# Patient Record
Sex: Female | Born: 2000 | Race: Black or African American | Hispanic: Yes | Marital: Single | Smoking: Never smoker
Health system: Southern US, Community
[De-identification: ages and names within clinical notes are randomized; demographics above are authoritative.]

## PROBLEM LIST (undated history)

## (undated) DIAGNOSIS — M25569 Pain in unspecified knee: Secondary | ICD-10-CM

## (undated) DIAGNOSIS — F419 Anxiety disorder, unspecified: Secondary | ICD-10-CM

## (undated) DIAGNOSIS — R12 Heartburn: Secondary | ICD-10-CM

## (undated) DIAGNOSIS — E739 Lactose intolerance, unspecified: Secondary | ICD-10-CM

## (undated) DIAGNOSIS — R0602 Shortness of breath: Secondary | ICD-10-CM

## (undated) DIAGNOSIS — M722 Plantar fascial fibromatosis: Secondary | ICD-10-CM

## (undated) DIAGNOSIS — F32A Depression, unspecified: Secondary | ICD-10-CM

## (undated) DIAGNOSIS — F329 Major depressive disorder, single episode, unspecified: Secondary | ICD-10-CM

## (undated) DIAGNOSIS — T7840XA Allergy, unspecified, initial encounter: Secondary | ICD-10-CM

## (undated) DIAGNOSIS — G4730 Sleep apnea, unspecified: Secondary | ICD-10-CM

## (undated) DIAGNOSIS — H539 Unspecified visual disturbance: Secondary | ICD-10-CM

## (undated) HISTORY — DX: Pain in unspecified knee: M25.569

## (undated) HISTORY — DX: Anxiety disorder, unspecified: F41.9

## (undated) HISTORY — DX: Lactose intolerance, unspecified: E73.9

## (undated) HISTORY — DX: Plantar fascial fibromatosis: M72.2

## (undated) HISTORY — PX: FRACTURE SURGERY: SHX138

## (undated) HISTORY — DX: Heartburn: R12

## (undated) HISTORY — DX: Major depressive disorder, single episode, unspecified: F32.9

## (undated) HISTORY — DX: Depression, unspecified: F32.A

## (undated) HISTORY — DX: Shortness of breath: R06.02

## (undated) HISTORY — DX: Sleep apnea, unspecified: G47.30

---
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
## 2019-01-31 ENCOUNTER — Other Ambulatory Visit: Payer: Self-pay

## 2019-01-31 ENCOUNTER — Encounter: Payer: Self-pay | Admitting: Family Medicine

## 2019-01-31 ENCOUNTER — Ambulatory Visit (INDEPENDENT_AMBULATORY_CARE_PROVIDER_SITE_OTHER): Payer: BC Managed Care – PPO | Admitting: Family Medicine

## 2019-01-31 VITALS — BP 117/81 | Temp 97.3°F

## 2019-01-31 DIAGNOSIS — F909 Attention-deficit hyperactivity disorder, unspecified type: Secondary | ICD-10-CM | POA: Diagnosis not present

## 2019-01-31 DIAGNOSIS — F329 Major depressive disorder, single episode, unspecified: Secondary | ICD-10-CM

## 2019-01-31 DIAGNOSIS — F419 Anxiety disorder, unspecified: Secondary | ICD-10-CM | POA: Diagnosis not present

## 2019-01-31 DIAGNOSIS — F32A Depression, unspecified: Secondary | ICD-10-CM

## 2019-01-31 MED ORDER — BUPROPION HCL ER (XL) 150 MG PO TB24: 150.0000 mg | ORAL_TABLET | Freq: Every day | ORAL | 3 refills | Status: DC

## 2019-01-31 NOTE — Progress Notes (Signed)
   Virtual Visit via Video   I connected with patient on 01/31/19 at  4:00 PM EDT by a video enabled telemedicine application and verified that I am speaking with the correct person using two identifiers.  Location patient: Home Location provider: Acupuncturist, Office Persons participating in the virtual visit: Patient, Provider, [place] (Jess B)  I discussed the limitations of evaluation and management by telemedicine and the availability of in person appointments. The patient expressed understanding and agreed to proceed.  Subjective:   HPI:   ? ADHD- pt reports difficulty 'focusing, concentrating, and stuff'.  She reports this has been going on for a few years.  Never had to work hard in [employer] b/c 'it came easy'.  When she transitioned to college she began to have trouble and sxs worsened even more when learning went remote.  Pt reports being 'very fidgety'.  Easily distracted.  At times has hard time sitting still.  Pt reports she cannot finish stories b/c she will go off on tangents- people often comment on this.  Pt has hx of anxiety and feels she gets overwhelmed easily.  Is now grinding teeth at night.  + social anxiety.  Pt also w/ hx of depression.  Pt reports depression 'comes in waves'.  Has never been evaluated for ADHD.  ROS:   See pertinent positives and negatives per HPI.  Patient Active Problem List   Diagnosis Date Noted  . Bilateral impacted cerumen 12/06/2018  . Physical exam 09/02/2017    Social History   Tobacco Use  . Smoking status: Never Smoker  . Smokeless tobacco: Never Used  Substance Use Topics  . Alcohol use: Never    Frequency: Never    Current Outpatient Medications:  .  TRI-LO-ESTARYLLA 0.18/0.215/0.25 MG-25 MCG tab, TK 1 T PO QD, Disp: , Rfl: 0  No Known Allergies  Objective:   BP 117/81   Temp (!) 97.3 F (36.3 C) (Tympanic)   AAOx3, NAD NCAT, EOMI No obvious CN deficits obese Coloring WNL Pt is able to speak clearly, coherently  without shortness of breath or increased work of breathing.  Thought process is linear.  Mood is appropriate.   Assessment and Plan:   Possible ADHD- new. pt reports this has been ongoing for years but did not really impact her until last year when she went to college.  Sxs are more difficult to manage now that things have switched to remote learning.  She has never had an ADHD evaluation which will be needed to ensure we are treating her appropriately.  Referral placed for her to have complete evaluation.  In the meantime, will start Wellbutrin to treat underlying anxiety/depression and possible ADHD.  Pt expressed understanding and is in agreement w/ plan.   Anxiety/Depression- deteriorated.  Pt admits to increased anxiety and depression w/ all of the changes (going to college, COVID, etc)  We discussed that untreated anxiety/depression can also impact ability to focus and complete tasks.  Pt understands.  In the meantime, will start Wellbutrin to help w/ anxiety/depression and possible ADHD.  Pt expressed understanding and is in agreement w/ plan.    Annye Asa, MD 01/31/2019

## 2019-02-11 ENCOUNTER — Other Ambulatory Visit: Payer: Self-pay | Admitting: Orthopedic Surgery

## 2019-02-11 NOTE — Telephone Encounter (Signed)
Please advise.t ahnks.

## 2019-02-23 ENCOUNTER — Ambulatory Visit (INDEPENDENT_AMBULATORY_CARE_PROVIDER_SITE_OTHER): Payer: BC Managed Care – PPO | Admitting: Family Medicine

## 2019-02-23 ENCOUNTER — Encounter: Payer: Self-pay | Admitting: Family Medicine

## 2019-02-23 DIAGNOSIS — F909 Attention-deficit hyperactivity disorder, unspecified type: Secondary | ICD-10-CM

## 2019-02-23 DIAGNOSIS — F419 Anxiety disorder, unspecified: Secondary | ICD-10-CM

## 2019-02-23 DIAGNOSIS — F329 Major depressive disorder, single episode, unspecified: Secondary | ICD-10-CM

## 2019-02-23 DIAGNOSIS — F32A Depression, unspecified: Secondary | ICD-10-CM

## 2019-02-23 MED ORDER — CITALOPRAM HYDROBROMIDE 20 MG PO TABS: 20.0000 mg | ORAL_TABLET | Freq: Every day | ORAL | 3 refills | Status: DC

## 2019-02-23 NOTE — Progress Notes (Signed)
   Virtual Visit via Video   I connected with patient on 02/23/19 at  4:00 PM EST by a video enabled telemedicine application and verified that I am speaking with the correct person using two identifiers.  Location patient: Home Location provider: Acupuncturist, Office Persons participating in the virtual visit: Patient, Provider, Anna (Katie N)  I discussed the limitations of evaluation and management by telemedicine and the availability of in person appointments. The patient expressed understanding and agreed to proceed.  Subjective:   HPI:   Anxiety- pt was started on Wellbutrin at last visit to try and improve both anxiety and ADHD.  Pt reports no noticeable difference in anxiety or focus but has noticed decreased appetite.  Pt is still eating 2-3 meals/day but has cut out snacking and portion sizes are smaller.  Pt has scheduled ADHD assessment.  Pt feels the anxiety is more paralyzing and 'shuts me down'.  ROS:   See pertinent positives and negatives per HPI.  Patient Active Problem List   Diagnosis Date Noted  . Bilateral impacted cerumen 12/06/2018  . Physical exam 09/02/2017    Social History   Tobacco Use  . Smoking status: Never Smoker  . Smokeless tobacco: Never Used  Substance Use Topics  . Alcohol use: Never    Frequency: Never    Current Outpatient Medications:  .  buPROPion (WELLBUTRIN XL) 150 MG 24 hr tablet, Take 1 tablet (150 mg total) by mouth daily., Disp: 30 tablet, Rfl: 3 .  meloxicam (MOBIC) 15 MG tablet, TAKE 1 TABLET BY MOUTH EVERY DAY AS NEEDED FOR PAIN, Disp: 30 tablet, Rfl: 0 .  TRI-LO-ESTARYLLA 0.18/0.215/0.25 MG-25 MCG tab, TK 1 T PO QD, Disp: , Rfl: 0  No Known Allergies  Objective:   There were no vitals taken for this visit.  AAOx3, NAD obese NCAT, EOMI No obvious CN deficits Coloring WNL Pt is able to speak clearly, coherently without shortness of breath or increased work of breathing.  Thought process is linear.  Mood is  appropriate.   Assessment and Plan:   Anxiety/depression- pt scored a 17 on her PHQ9 today.  Discussed that based on this, we need to start SSRI to get sxs under better control.  She reports her anxiety can be paralyzing at time.  Agreeable to starting SSRI.  Will try Celexa knowing that we may have to titrate or try another entirely.  Pt expressed understanding and is in agreement w/ plan.   ADHD- pt has set up her assessment and we will decide on medication once results available.  Pt expressed understanding and is in agreement w/ plan.    Anna Asa, MD 02/23/2019

## 2019-03-14 ENCOUNTER — Encounter: Payer: Self-pay | Admitting: Family Medicine

## 2019-04-18 ENCOUNTER — Ambulatory Visit (INDEPENDENT_AMBULATORY_CARE_PROVIDER_SITE_OTHER): Payer: BC Managed Care – PPO | Admitting: Psychology

## 2019-04-18 DIAGNOSIS — F339 Major depressive disorder, recurrent, unspecified: Secondary | ICD-10-CM

## 2019-04-18 DIAGNOSIS — F419 Anxiety disorder, unspecified: Secondary | ICD-10-CM

## 2019-04-21 ENCOUNTER — Ambulatory Visit: Payer: BC Managed Care – PPO | Admitting: Psychology

## 2019-05-16 ENCOUNTER — Ambulatory Visit: Payer: BC Managed Care – PPO | Admitting: Psychology

## 2019-05-17 DIAGNOSIS — F4310 Post-traumatic stress disorder, unspecified: Secondary | ICD-10-CM | POA: Diagnosis not present

## 2019-05-17 DIAGNOSIS — F332 Major depressive disorder, recurrent severe without psychotic features: Secondary | ICD-10-CM | POA: Diagnosis not present

## 2019-06-05 ENCOUNTER — Other Ambulatory Visit: Payer: Self-pay | Admitting: Surgical

## 2019-06-16 DIAGNOSIS — M79671 Pain in right foot: Secondary | ICD-10-CM | POA: Insufficient documentation

## 2019-07-02 ENCOUNTER — Other Ambulatory Visit: Payer: Self-pay | Admitting: Surgical

## 2019-07-05 ENCOUNTER — Other Ambulatory Visit: Payer: Self-pay | Admitting: Emergency Medicine

## 2019-07-05 MED ORDER — BUPROPION HCL ER (XL) 150 MG PO TB24: 150.0000 mg | ORAL_TABLET | Freq: Every day | ORAL | 3 refills | Status: DC

## 2019-07-07 ENCOUNTER — Telehealth: Payer: BC Managed Care – PPO | Admitting: Physician Assistant

## 2019-07-07 DIAGNOSIS — J302 Other seasonal allergic rhinitis: Secondary | ICD-10-CM

## 2019-07-07 MED ORDER — SYSTANE COMPLETE 0.6 % OP SOLN: 1.0000 [drp] | Freq: Two times a day (BID) | OPHTHALMIC | 0 refills | Status: DC

## 2019-07-07 MED ORDER — CETIRIZINE HCL 10 MG PO TABS: 10.0000 mg | ORAL_TABLET | Freq: Two times a day (BID) | ORAL | 0 refills | Status: DC

## 2019-07-07 MED ORDER — FLUTICASONE PROPIONATE 50 MCG/ACT NA SUSP: 2.0000 | Freq: Every day | NASAL | 0 refills | Status: DC

## 2019-07-07 NOTE — Progress Notes (Signed)
E visit for Allergic Rhinitis We are sorry that you are not feeling well.  Here is how we plan to help!  It seems like you are doing all the right things with nasal spray andAallegra.  I would suggest some modifications to your regimen to help control your symptoms.  I recommend Flonase 2x per day, zyrtec 10mg  2x per day and the addition of some eye drops.  I have sent prescriptions for you as needed.  Additionally, if you are still struggling, you may alternate day and night doses of antihistamines - for example if you take Zyrtec in the morning you could take Claritin or Allegra at night.  Sometimes just switching your antihistamine is helpful as your body may be used to the medication.  A humidifier at night will also help with symptoms.  Based on what you have shared with me it looks like you have Allergic Rhinitis.  Rhinitis is when a reaction occurs that causes nasal congestion, runny nose, sneezing, and itching.  Most types of rhinitis are caused by an inflammation and are associated with symptoms in the eyes ears or throat. There are several types of rhinitis.  The most common are acute rhinitis, which is usually caused by a viral illness, allergic or seasonal rhinitis, and nonallergic or year-round rhinitis.  Nasal allergies occur certain times of the year.  Allergic rhinitis is caused when allergens in the air trigger the release of histamine in the body.  Histamine causes itching, swelling, and fluid to build up in the fragile linings of the nasal passages, sinuses and eyelids.  An itchy nose and clear discharge are common.  I recommend the following over the counter treatments: Zyrtec 10mg  2x per day  I also would recommend a nasal spray: Flonase 2 sprays into each nostril once daily  You may also benefit from eye drops such as: Systane 1-2 driops each eye twice daily as needed  HOME CARE:   You can use an over-the-counter saline nasal spray as needed  Avoid areas where there is  heavy dust, mites, or molds  Stay indoors on windy days during the pollen season  Keep windows closed in home, at least in bedroom; use air conditioner.  Use high-efficiency house air filter  Keep windows closed in car, turn AC on re-circulate  Avoid playing out with dog during pollen season  GET HELP RIGHT AWAY IF:   If your symptoms do not improve within 10 days  You become short of breath  You develop yellow or green discharge from your nose for over 3 days  You have coughing fits  MAKE SURE YOU:   Understand these instructions  Will watch your condition  Will get help right away if you are not doing well or get worse  Thank you for choosing an e-visit. Your e-visit answers were reviewed by a board certified advanced clinical practitioner to complete your personal care plan. Depending upon the condition, your plan could have included both over the counter or prescription medications. Please review your pharmacy choice. Be sure that the pharmacy you have chosen is open so that you can pick up your prescription now.  If there is a problem you may message your provider in [place] to have the prescription routed to another pharmacy. Your safety is important to [place]. If you have drug allergies check your prescription carefully.  For the next 24 hours, you can use MyChart to ask questions about today's visit, request a non-urgent call back, or ask for  a work or school excuse from your e-visit provider. You will get an email in the next two days asking about your experience. I hope that your e-visit has been valuable and will speed your recovery.   Greater than 5 minutes, yet less than 10 minutes of time have been spent researching, coordinating, and implementing care for this patient today

## 2019-08-06 ENCOUNTER — Other Ambulatory Visit: Payer: Self-pay | Admitting: Surgical

## 2019-08-08 ENCOUNTER — Other Ambulatory Visit: Payer: Self-pay | Admitting: General Practice

## 2019-08-08 MED ORDER — CITALOPRAM HYDROBROMIDE 20 MG PO TABS: 20.0000 mg | ORAL_TABLET | Freq: Every day | ORAL | 3 refills | Status: DC

## 2019-08-08 NOTE — Telephone Encounter (Signed)
Ok to rf? 

## 2019-08-31 ENCOUNTER — Other Ambulatory Visit: Payer: Self-pay

## 2019-08-31 ENCOUNTER — Ambulatory Visit: Payer: BC Managed Care – PPO | Admitting: Orthopedic Surgery

## 2019-08-31 ENCOUNTER — Ambulatory Visit: Payer: Self-pay

## 2019-08-31 DIAGNOSIS — R200 Anesthesia of skin: Secondary | ICD-10-CM

## 2019-08-31 DIAGNOSIS — R202 Paresthesia of skin: Secondary | ICD-10-CM | POA: Diagnosis not present

## 2019-08-31 DIAGNOSIS — M79671 Pain in right foot: Secondary | ICD-10-CM

## 2019-09-05 ENCOUNTER — Encounter: Payer: Self-pay | Admitting: Orthopedic Surgery

## 2019-09-05 NOTE — Progress Notes (Signed)
Office Visit Note   Patient: Anna Holland           Date of Birth: 2001-03-14           MRN: [id number] Visit Date: 08/31/2019 Requested by: Midge Minium, MD [address] [place] [address] [place],  [place] [address] PCP: Midge Minium, MD  Subjective: Chief Complaint  Patient presents with  . Right Foot - Pain, Numbness    HPI: Anna Holland is a 19 y.o. female who presents to the office complaining of right foot pain.  She notes 3 months of pain in her right foot with pain as well as numbness/tingling that bothers her on the top of her foot between first and second toes and around the midfoot dorsally.  She had a steroid course that helped briefly but her symptoms came back.  She feels pain in her anterior shin that feels "like a bruise".  This pain is worse with dorsiflexion of her foot.  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  She notes symptoms get worse with walking and improves when she takes her break.  Symptoms also get worse when she does her cardio workout with foot weakness during the workout but resolves several minutes after she rests.  Her mom has noticed her foot slapping in shoes at times.  She has no history of surgery.  Denies any knee pain or low back pain.  He does have a history of a right ganglion cyst that was last evaluated in June 2020.  She takes meloxicam as needed.              ROS:  All systems reviewed are negative as they relate to the chief complaint within the history of present illness.  Patient denies fevers or chills.  Assessment & Plan: Visit Diagnoses:  1. Pain in right foot   2. Numbness and tingling of foot     Plan: Patient is a 19 year old female who presents complaining of right foot pain with numbness and tingling.  Pain is worse with exertion.  She has no significant tenderness to palpation throughout the right lower extremity compartments today.  X-rays reveal no acute osseous findings.  Symptoms seem to resolve with rest.   Ultrasound was performed in the office today no discrete masses visualized but MRI would be a better modality for definitive evaluation of a webspace cyst or ganglion..  Differential diagnosis includes cyst in the webspace between the first and second toes versus deep peroneal nerve compression.  No tenderness to palpation or evidence on physical examination of peroneal nerve compression around the fibular head.  Plan for 8-week return for clinical recheck.  We will decide on MRI or nerve conduction study or both at that point.  Patient agreed with this plan.  She will follow-up with the office sooner if her symptoms significantly worsen.  Follow-Up Instructions: No follow-ups on file.   Orders:  Orders Placed This Encounter  Procedures  . XR Foot Complete Right   No orders of the defined types were placed in this encounter.     Procedures: No procedures performed   Clinical Data: No additional findings.  Objective: Vital Signs: There were no vitals taken for this visit.  Physical Exam:  Constitutional: Patient appears well-developed HEENT:  Head: Normocephalic Eyes:EOM are normal Neck: Normal range of motion Cardiovascular: Normal rate Pulmonary/chest: Effort normal Neurologic: Patient is alert Skin: Skin is warm Psychiatric: Patient has normal mood and affect  Ortho  Exam:  Mild tenderness palpation in the anterior distal tibia.  Pain worse with resisted dorsiflexion.  No pain with passive dorsiflexion/plantarflexion of the ankle or toes.  Sensation intact through all dermatomes of the right lower extremity.  No Morton's neuroma identified by physical exam.  Mild tenderness palpation throughout the webspace between the first and second digits.  No tenderness palpation over the medial/lateral malleoli, retrocalcaneal space, Achilles tendon, anterior tibialis tendon, fifth metatarsal base, peroneal tendon, posterior tib tendon.  Dorsiflexion/plantarflexion/inversion/eversion intact  actively.  No antalgic gait  Specialty Comments:  No specialty comments available.  Imaging: No results found.   PMFS History: Patient Active Problem List   Diagnosis Date Noted  . Bilateral impacted cerumen 12/06/2018  . Physical exam 09/02/2017   Past Medical History:  Diagnosis Date  . Allergy   . Depression   . Vision abnormalities     Family History  Problem Relation Age of Onset  . Hypertension Mother   . Hyperlipidemia Mother   . Arthritis Father   . Arthritis Maternal Grandmother   . Diabetes Maternal Grandmother   . Hypertension Maternal Grandmother   . Miscarriages / Stillbirths Maternal Grandmother   . Stroke Maternal Grandmother   . Heart disease Maternal Grandfather   . Hypertension Maternal Grandfather   . Stroke Maternal Grandfather   . Depression Brother     Past Surgical History:  Procedure Laterality Date  . FRACTURE SURGERY     left arm in 2009  . LAPAROSCOPIC APPENDECTOMY N/A 12/30/2012   Procedure: APPENDECTOMY LAPAROSCOPIC;  Surgeon: Judie Petit. Leonia Corona, MD;  Location: [facility] OR;  Service: Pediatrics;  Laterality: N/A;   Social History   Occupational History  . Occupation: Consulting civil engineer  Tobacco Use  . Smoking status: Never Smoker  . Smokeless tobacco: Never Used  Substance and Sexual Activity  . Alcohol use: Never  . Drug use: No  . Sexual activity: Yes    Birth control/protection: Pill

## 2019-09-12 ENCOUNTER — Ambulatory Visit: Payer: BC Managed Care – PPO

## 2019-09-13 ENCOUNTER — Other Ambulatory Visit: Payer: Self-pay

## 2019-09-13 ENCOUNTER — Ambulatory Visit (INDEPENDENT_AMBULATORY_CARE_PROVIDER_SITE_OTHER): Payer: BC Managed Care – PPO

## 2019-09-13 DIAGNOSIS — Z111 Encounter for screening for respiratory tuberculosis: Secondary | ICD-10-CM

## 2019-09-13 NOTE — Progress Notes (Addendum)
Anna Holland, 19 year old female, presents to the office for ppd placement. Tuberculin Purified Protein Derivative 0.53mL placed in left forearm. Patient tolerated well and informed to return for reading in 48 hours (Thursday). Patient voiced understanding.  The above order is mine.  Neena Rhymes, MD

## 2019-09-14 NOTE — Addendum Note (Signed)
Addended by: Sheliah Hatch on: 09/14/2019 08:01 AM   Modules accepted: Level of Service

## 2019-09-15 ENCOUNTER — Other Ambulatory Visit: Payer: Self-pay

## 2019-09-15 ENCOUNTER — Ambulatory Visit: Payer: BC Managed Care – PPO

## 2019-09-15 DIAGNOSIS — Z111 Encounter for screening for respiratory tuberculosis: Secondary | ICD-10-CM

## 2019-09-15 LAB — TB SKIN TEST
Induration: 0 mm
TB Skin Test: NEGATIVE

## 2019-09-15 NOTE — Progress Notes (Signed)
Anna Holland, 19 year old presents to the office for a PPD reading. Left arm shows 0 mm induration. Patient left the office in good condition. Lana Fish, LPN

## 2019-12-30 ENCOUNTER — Ambulatory Visit: Payer: BC Managed Care – PPO | Admitting: Podiatry

## 2020-06-13 ENCOUNTER — Other Ambulatory Visit: Payer: Self-pay

## 2020-06-13 ENCOUNTER — Encounter: Payer: Self-pay | Admitting: Family Medicine

## 2020-06-13 ENCOUNTER — Ambulatory Visit (INDEPENDENT_AMBULATORY_CARE_PROVIDER_SITE_OTHER): Payer: BC Managed Care – PPO | Admitting: Family Medicine

## 2020-06-13 VITALS — BP 125/70 | HR 67 | Temp 97.6°F | Resp 18 | Ht 60.5 in | Wt 238.8 lb

## 2020-06-13 DIAGNOSIS — Z23 Encounter for immunization: Secondary | ICD-10-CM

## 2020-06-13 DIAGNOSIS — Z0000 Encounter for general adult medical examination without abnormal findings: Secondary | ICD-10-CM

## 2020-06-13 DIAGNOSIS — Z114 Encounter for screening for human immunodeficiency virus [HIV]: Secondary | ICD-10-CM

## 2020-06-13 DIAGNOSIS — K219 Gastro-esophageal reflux disease without esophagitis: Secondary | ICD-10-CM

## 2020-06-13 LAB — CBC WITH DIFFERENTIAL/PLATELET
Basophils Absolute: 0.1 10*3/uL (ref 0.0–0.1)
Basophils Relative: 1.3 % (ref 0.0–3.0)
Eosinophils Absolute: 0.1 10*3/uL (ref 0.0–0.7)
Eosinophils Relative: 1.5 % (ref 0.0–5.0)
HCT: 37.3 % (ref 36.0–46.0)
Hemoglobin: 12.5 g/dL (ref 12.0–15.0)
Lymphocytes Relative: 34.2 % (ref 12.0–46.0)
Lymphs Abs: 3.2 10*3/uL (ref 0.7–4.0)
MCHC: 33.5 g/dL (ref 30.0–36.0)
MCV: 78.7 fl (ref 78.0–100.0)
Monocytes Absolute: 0.3 10*3/uL (ref 0.1–1.0)
Monocytes Relative: 3.7 % (ref 3.0–12.0)
Neutro Abs: 5.6 10*3/uL (ref 1.4–7.7)
Neutrophils Relative %: 59.3 % (ref 43.0–77.0)
Platelets: 383 10*3/uL (ref 150.0–400.0)
RBC: 4.75 Mil/uL (ref 3.87–5.11)
RDW: 14.2 % (ref 11.5–15.5)
WBC: 9.5 10*3/uL (ref 4.0–10.5)

## 2020-06-13 LAB — LIPID PANEL
Cholesterol: 163 mg/dL (ref 0–200)
HDL: 33.3 mg/dL — ABNORMAL LOW (ref >39.00)
LDL Cholesterol: 106 mg/dL — ABNORMAL HIGH (ref 0–99)
NonHDL: 129.46
Total CHOL/HDL Ratio: 5
Triglycerides: 116 mg/dL (ref 0.0–149.0)
VLDL: 23.2 mg/dL (ref 0.0–40.0)

## 2020-06-13 LAB — BASIC METABOLIC PANEL
BUN: 9 mg/dL (ref 6–23)
CO2: 26 mEq/L (ref 19–32)
Calcium: 9.5 mg/dL (ref 8.4–10.5)
Chloride: 103 mEq/L (ref 96–112)
Creatinine, Ser: 0.46 mg/dL (ref 0.40–1.20)
GFR: 137.17 mL/min (ref >60.00)
Glucose, Bld: 82 mg/dL (ref 70–99)
Potassium: 3.7 mEq/L (ref 3.5–5.1)
Sodium: 137 mEq/L (ref 135–145)

## 2020-06-13 LAB — HEPATIC FUNCTION PANEL
ALT: 11 U/L (ref 0–35)
AST: 15 U/L (ref 0–37)
Albumin: 3.8 g/dL (ref 3.5–5.2)
Alkaline Phosphatase: 106 U/L (ref 39–117)
Bilirubin, Direct: 0.1 mg/dL (ref 0.0–0.3)
Total Bilirubin: 0.3 mg/dL (ref 0.2–1.2)
Total Protein: 7.9 g/dL (ref 6.0–8.3)

## 2020-06-13 LAB — TSH: TSH: 2.94 u[IU]/mL (ref 0.35–4.50)

## 2020-06-13 LAB — VITAMIN D 25 HYDROXY (VIT D DEFICIENCY, FRACTURES): VITD: 19.7 ng/mL — ABNORMAL LOW (ref 30.00–100.00)

## 2020-06-13 MED ORDER — PANTOPRAZOLE SODIUM 40 MG PO TBEC: 40.0000 mg | DELAYED_RELEASE_TABLET | Freq: Every day | ORAL | 1 refills | Status: DC

## 2020-06-13 NOTE — Progress Notes (Signed)
   Subjective:    Patient ID: Anna Holland, female    DOB: 2000-07-19, 20 y.o.   MRN: [id number]  HPI CPE-  Due for Tdap.  UTD on COVID, Flu.  Due to start pap smears- scheduled for October.  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   Patient Care Team    Relationship Specialty Notifications Start End  Sheliah Hatch, MD PCP - General Family Medicine  09/02/17   Jacqlyn Krauss, MD Referring Physician Dermatology  09/02/17   Hal Morales, MD (Inactive) Consulting Physician Obstetrics and Gynecology  09/02/17      Health Maintenance  Topic Date Due  . Hepatitis C Screening  Never done  . HIV Screening  Never done  . TETANUS/TDAP  06/04/2020  . PAP-Cervical Cytology Screening  06/03/2020  . PAP SMEAR-Modifier  06/03/2020  . CHLAMYDIA SCREENING  06/13/2021 (Originally 06/03/2014)  . INFLUENZA VACCINE  Completed  . HPV VACCINES  Completed  . COVID-19 Vaccine  Completed      Review of Systems Patient reports no vision/ hearing changes, adenopathy,fever, persistant/recurrent hoarseness , swallowing issues, chest pain, palpitations, edema, persistant/recurrent cough, hemoptysis, dyspnea (rest/exertional/paroxysmal nocturnal), gastrointestinal bleeding (melena, rectal bleeding), abdominal pain, bowel changes, GU symptoms (dysuria, hematuria, incontinence), Gyn symptoms (abnormal  bleeding, pain),  syncope, focal weakness, memory loss, numbness & tingling, skin/hair/nail changes, abnormal bruising or bleeding, anxiety, or depression.   + 20 lb weight gain + heartburn 'with everything I eat'- taking Pepcid and OTC antacids w/ continued sxs  This visit occurred during the SARS-CoV-2 public health emergency.  Safety protocols were in place, including screening questions prior to the visit, additional usage of staff PPE, and extensive cleaning of exam room while observing appropriate contact  time as indicated for disinfecting solutions.       Objective:   Physical Exam General Appearance:    Alert, cooperative, no distress, appears stated age  Head:    Normocephalic, without obvious abnormality, atraumatic  Eyes:    PERRL, conjunctiva/corneas clear, EOM's intact, fundi    benign, both eyes  Ears:    Normal TM's and external ear canals, both ears  Nose:   Nares normal, septum midline, mucosa normal, no drainage    or sinus tenderness  Throat:   Lips, mucosa, and tongue normal; teeth and gums normal  Neck:   Supple, symmetrical, trachea midline, no adenopathy;    Thyroid: no enlargement/tenderness/nodules  Back:     Symmetric, no curvature, ROM normal, no CVA tenderness  Lungs:     Clear to auscultation bilaterally, respirations unlabored  Chest Wall:    No tenderness or deformity   Heart:    Regular rate and rhythm, S1 and S2 normal, no murmur, rub   or gallop  Breast Exam:    Deferred to GYN  Abdomen:     Soft, non-tender, bowel sounds active all four quadrants,    no masses, no organomegaly  Genitalia:    Deferred to GYN  Rectal:    Extremities:   Extremities normal, atraumatic, no cyanosis or edema  Pulses:   2+ and symmetric all extremities  Skin:   Skin color, texture, turgor normal, no rashes or lesions  Lymph nodes:   Cervical, supraclavicular, and axillary nodes normal  Neurologic:   CNII-XII intact, normal strength, sensation and reflexes    throughout          Assessment & Plan:

## 2020-06-13 NOTE — Assessment & Plan Note (Signed)
Deteriorated.  Pt has gained 20 lbs since last visit.  Discussed need for healthy diet and regular exercise.  Check labs to risk stratify.  Will follow.

## 2020-06-13 NOTE — Patient Instructions (Signed)
Follow up in 6 months to recheck weight loss progress We'll notify you of your lab results and make any changes if needed Check with the [facility] about training options YOU. CAN. DO. THIS. START the Pantoprazole daily to help with the heartburn Call with any questions or concerns Stay Safe!  Stay Healthy! CONGRATS ON NURSING SCHOOL!!

## 2020-06-13 NOTE — Assessment & Plan Note (Signed)
Pt's PE WNL w/ exception of obesity.  UTD on COVID and flu.  Tdap given today.  Has GYN exam scheduled.  Check labs.  Anticipatory guidance provided.

## 2020-06-13 NOTE — Assessment & Plan Note (Signed)
New.  Pt's sxs are moderate to severe and occur after every meal- despite taking Pepcid and OTC antacids.  Will start Protonix.  Discussed dietary and lifestyle modifications that will improve sxs.  Pt expressed understanding and is in agreement w/ plan.

## 2020-06-14 ENCOUNTER — Other Ambulatory Visit: Payer: Self-pay

## 2020-06-14 DIAGNOSIS — E559 Vitamin D deficiency, unspecified: Secondary | ICD-10-CM

## 2020-06-14 LAB — HIV ANTIBODY (ROUTINE TESTING W REFLEX): HIV 1&2 Ab, 4th Generation: NONREACTIVE

## 2020-06-14 LAB — EXTRA SPECIMEN

## 2020-06-14 MED ORDER — VITAMIN D (ERGOCALCIFEROL) 1.25 MG (50000 UNIT) PO CAPS: 50000.0000 [IU] | ORAL_CAPSULE | ORAL | 0 refills | Status: DC

## 2020-06-29 ENCOUNTER — Telehealth: Payer: Self-pay | Admitting: Family Medicine

## 2020-06-29 NOTE — Telephone Encounter (Signed)
Patient called needing a tb blood test for her new job - she also needs a physical paper filled out - just had her physical on 06/13/2020 - please advise

## 2020-07-02 ENCOUNTER — Other Ambulatory Visit: Payer: Self-pay

## 2020-07-02 DIAGNOSIS — Z111 Encounter for screening for respiratory tuberculosis: Secondary | ICD-10-CM

## 2020-07-02 NOTE — Telephone Encounter (Signed)
We can complete her forms based on her recent physical and she can have a lab visit for quantiferon for TB screen

## 2020-07-02 NOTE — Telephone Encounter (Signed)
Ok to order a Tb blood test?

## 2020-07-02 NOTE — Telephone Encounter (Signed)
Called patient and informed that per provider that she can come in for a lab only visit for a TB scre/ening. Patient understood. Patient is scheduled for 07/23/20 at 9.

## 2020-07-10 ENCOUNTER — Encounter: Payer: Self-pay | Admitting: Family Medicine

## 2020-07-10 MED ORDER — FLUTICASONE PROPIONATE 50 MCG/ACT NA SUSP: 2.0000 | Freq: Every day | NASAL | 3 refills | Status: DC

## 2020-07-10 MED ORDER — SYSTANE COMPLETE 0.6 % OP SOLN: 1.0000 [drp] | Freq: Two times a day (BID) | OPHTHALMIC | 3 refills | Status: DC

## 2020-07-23 ENCOUNTER — Telehealth: Payer: Self-pay | Admitting: Family Medicine

## 2020-07-23 ENCOUNTER — Other Ambulatory Visit: Payer: BC Managed Care – PPO

## 2020-07-23 ENCOUNTER — Other Ambulatory Visit: Payer: Self-pay

## 2020-07-23 DIAGNOSIS — Z111 Encounter for screening for respiratory tuberculosis: Secondary | ICD-10-CM

## 2020-07-23 NOTE — Telephone Encounter (Signed)
Patient forms are up front to be completed. Patient would like a call back when paperwork is ready. Please Advise.

## 2020-07-23 NOTE — Telephone Encounter (Signed)
Forms placed in bin to be filled 

## 2020-07-23 NOTE — Telephone Encounter (Signed)
..  Type of form received:  Statement of Mental & Physical Health Additional comments:   Received OT:LXBWI Atkins  Form should be Faxed to:  Form should be mailed to:    Is patient requesting call for pickup:  [phone]   Form placed:  In Dr. Rennis Golden bin up front  Attach charge sheet.  Provider will determine charge.  Individual made aware of 3-5 business day turn around (Y/N)?

## 2020-07-25 LAB — QUANTIFERON-TB GOLD PLUS
Mitogen-NIL: >10 IU/mL
NIL: 0.02 IU/mL
QuantiFERON-TB Gold Plus: NEGATIVE
TB1-NIL: 0.01 IU/mL
TB2-NIL: 0.01 IU/mL

## 2020-07-26 NOTE — Telephone Encounter (Signed)
Form completed and placed in basket  

## 2020-07-26 NOTE — Telephone Encounter (Signed)
I have made pt aware of the form is ready for pick up. I have sent a copy to scan.

## 2020-07-30 ENCOUNTER — Encounter: Payer: Self-pay | Admitting: Family Medicine

## 2020-10-03 ENCOUNTER — Encounter: Payer: Self-pay | Admitting: *Deleted

## 2020-11-07 ENCOUNTER — Other Ambulatory Visit: Payer: Self-pay

## 2020-11-07 DIAGNOSIS — F32A Depression, unspecified: Secondary | ICD-10-CM

## 2020-11-07 DIAGNOSIS — F419 Anxiety disorder, unspecified: Secondary | ICD-10-CM

## 2020-11-07 MED ORDER — CITALOPRAM HYDROBROMIDE 20 MG PO TABS: 20.0000 mg | ORAL_TABLET | Freq: Every day | ORAL | 3 refills | Status: DC

## 2020-11-07 MED ORDER — BUPROPION HCL ER (XL) 150 MG PO TB24: 150.0000 mg | ORAL_TABLET | Freq: Every day | ORAL | 3 refills | Status: DC

## 2020-12-10 DIAGNOSIS — Z0289 Encounter for other administrative examinations: Secondary | ICD-10-CM

## 2020-12-14 ENCOUNTER — Ambulatory Visit: Payer: BC Managed Care – PPO | Admitting: Family Medicine

## 2020-12-20 ENCOUNTER — Encounter (INDEPENDENT_AMBULATORY_CARE_PROVIDER_SITE_OTHER): Payer: Self-pay | Admitting: Bariatrics

## 2020-12-20 ENCOUNTER — Other Ambulatory Visit: Payer: Self-pay

## 2020-12-20 ENCOUNTER — Ambulatory Visit (INDEPENDENT_AMBULATORY_CARE_PROVIDER_SITE_OTHER): Payer: BC Managed Care – PPO | Admitting: Bariatrics

## 2020-12-20 VITALS — BP 120/80 | HR 77 | Temp 98.6°F | Ht 60.0 in | Wt 235.0 lb

## 2020-12-20 DIAGNOSIS — Z1331 Encounter for screening for depression: Secondary | ICD-10-CM | POA: Diagnosis not present

## 2020-12-20 DIAGNOSIS — M722 Plantar fascial fibromatosis: Secondary | ICD-10-CM

## 2020-12-20 DIAGNOSIS — L209 Atopic dermatitis, unspecified: Secondary | ICD-10-CM | POA: Insufficient documentation

## 2020-12-20 DIAGNOSIS — Z6841 Body Mass Index (BMI) 40.0 and over, adult: Secondary | ICD-10-CM

## 2020-12-20 DIAGNOSIS — K219 Gastro-esophageal reflux disease without esophagitis: Secondary | ICD-10-CM

## 2020-12-20 DIAGNOSIS — R0602 Shortness of breath: Secondary | ICD-10-CM | POA: Diagnosis not present

## 2020-12-20 DIAGNOSIS — R5383 Other fatigue: Secondary | ICD-10-CM

## 2020-12-20 DIAGNOSIS — Z9189 Other specified personal risk factors, not elsewhere classified: Secondary | ICD-10-CM | POA: Diagnosis not present

## 2020-12-20 DIAGNOSIS — E66813 Obesity, class 3: Secondary | ICD-10-CM

## 2020-12-20 DIAGNOSIS — R0683 Snoring: Secondary | ICD-10-CM

## 2020-12-20 DIAGNOSIS — E559 Vitamin D deficiency, unspecified: Secondary | ICD-10-CM | POA: Diagnosis not present

## 2020-12-20 DIAGNOSIS — R7309 Other abnormal glucose: Secondary | ICD-10-CM

## 2020-12-20 DIAGNOSIS — L700 Acne vulgaris: Secondary | ICD-10-CM | POA: Insufficient documentation

## 2020-12-20 NOTE — Progress Notes (Signed)
Chief Complaint:   OBESITY Anna Holland (MR# [id number]) is a 20 y.o. female who presents for evaluation and treatment of obesity and related comorbidities. Current BMI is Body mass index is 45.9 kg/m. Anna Holland has been struggling with her weight for many years and has been unsuccessful in either losing weight, maintaining weight loss, or reaching her healthy weight goal.  Anna Holland is currently in the action stage of change and ready to dedicate time achieving and maintaining a healthier weight. Anna Holland is interested in becoming our patient and working on intensive lifestyle modifications including (but not limited to) diet and exercise for weight loss.  Anna Holland likes to cook but notes time as an obstacle.  She craves sweets at times.  Anna Holland's habits were reviewed today and are as follows: Her family eats meals together, she thinks her family will eat healthier with her, her desired weight loss is 76 pounds, she has been heavy most of her life, she started gaining weight in college, her heaviest weight ever was 237 pounds, she craves milkshakes and bread, she skips lunch frequently, she is frequently drinking liquids with calories, she frequently makes poor food choices, she frequently eats larger portions than normal, and she struggles with emotional eating.  Depression Screen Anna Holland's Food and Mood (modified PHQ-9) score was 14.  Depression screen PHQ 2/9 12/20/2020  Decreased Interest 1  Down, Depressed, Hopeless 2  PHQ - 2 Score 3  Altered sleeping 1  Tired, decreased energy 3  Change in appetite 2  Feeling bad or failure about yourself  2  Trouble concentrating 2  Moving slowly or fidgety/restless 1  Suicidal thoughts 0  PHQ-9 Score 14  Difficult doing work/chores Somewhat difficult   Subjective:   1. Other fatigue Anna Holland denies daytime somnolence and reports waking up still tired. Patent has a history of symptoms of morning fatigue and snoring. Anna Holland generally gets 6 hours of  sleep per night, and states that she has poor quality sleep. Snoring is present. Apneic episodes are not present. Epworth Sleepiness Score is 9.  Occurs with certain activities.  2. SOB (shortness of breath) on exertion Anna Holland notes increasing shortness of breath with exercising and seems to be worsening over time with weight gain. She notes getting out of breath sooner with activity than she used to. This has gotten worse recently. Anna Holland denies shortness of breath at rest or orthopnea.  Occurs with certain activities.  3. Vitamin D deficiency She is currently taking no vitamin D supplement.  Vitamin D 19.7 on 06/13/2020.  Lab Results  Component Value Date   VD25OH 19.70 (L) 06/13/2020   VD25OH 12.68 (L) 09/02/2017   4. Gastroesophageal reflux disease, unspecified whether esophagitis present Taking pantoprazole.  She says it helps, but GERD is not resolved.  5. Plantar fasciitis Taking meloxicam.  6. Snoring Anna Holland endorses snoring.  She has not had a sleep study.  7. Elevated glucose No medications.  No family history of diabetes.  8. Depression screen Anna Holland was screened for depression as part of her new patient workup today.  PHQ-9 is 14.  9. At risk for activity intolerance [name] is at risk for activity intolerance due to obesity.  Assessment/Plan:   1. Other fatigue [name] does feel that her weight is causing her energy to be lower than it should be. Fatigue may be related to obesity, depression or many other causes. Labs will be ordered, and in the meanwhile, [name] will focus on self care including making healthy food choices,  increasing physical activity and focusing on stress reduction.  Gradually increase activities.  - EKG 12-Lead - T3 - T4, free - TSH  2. SOB (shortness of breath) on exertion [name] does feel that she gets out of breath more easily that she used to when she exercises. [name] shortness of breath appears to be obesity related and exercise  induced. She has agreed to work on weight loss and gradually increase exercise to treat her exercise induced shortness of breath. Will continue to monitor closely.  Gradually increase activities.  3. Vitamin D deficiency Will check vitamin D level today.  - VITAMIN D 25 Hydroxy (Vit-D Deficiency, Fractures)  4. Gastroesophageal reflux disease, unspecified whether esophagitis present Intensive lifestyle modifications are the first line treatment for this issue. We discussed several lifestyle modifications today and she will continue to work on diet, exercise and weight loss efforts. Orders and follow up as documented in patient record. Continue medications.  Will watch her triggers.  Counseling If a person has gastroesophageal reflux disease (GERD), food and stomach acid move back up into the esophagus and cause symptoms or problems such as damage to the esophagus. Anti-reflux measures include: raising the head of the bed, avoiding tight clothing or belts, avoiding eating late at night, not lying down shortly after mealtime, and achieving weight loss. Avoid ASA, NSAID's, caffeine, alcohol, and tobacco.  OTC Pepcid and/or Tums are often very helpful for as needed use.  However, for persisting chronic or daily symptoms, stronger medications like Omeprazole may be needed. You may need to avoid foods and drinks such as: Coffee and tea (with or without caffeine). Drinks that contain alcohol. Energy drinks and sports drinks. Bubbly (carbonated) drinks or sodas. Chocolate and cocoa. Peppermint and mint flavorings. Garlic and onions. Horseradish. Spicy and acidic foods. These include peppers, chili powder, curry powder, vinegar, hot sauces, and BBQ sauce. Citrus fruit juices and citrus fruits, such as oranges, lemons, and limes. Tomato-based foods. These include red sauce, chili, salsa, and pizza with red sauce. Fried and fatty foods. These include donuts, french fries, potato chips, and high-fat  dressings. High-fat meats. These include hot dogs, rib eye steak, sausage, ham, and bacon.  - Comprehensive metabolic panel - T3 - T4, free - TSH  5. Plantar fasciitis Watch over triggers.  Wear good shoes.  6. Snoring Will wait and see if weight loss improves symptoms.  If not, then do sleep study.  7. Elevated glucose Will check A1c and insulin today.  - Hemoglobin A1c - Insulin, random  8. Depression screen Anna Holland had a positive depression screening. Depression is commonly associated with obesity and often results in emotional eating behaviors. We will monitor this closely and work on CBT to help improve the non-hunger eating patterns. Referral to Psychology may be required if no improvement is seen as she continues in our clinic.  9. At risk for activity intolerance Anna Holland was given approximately 15 minutes of exercise intolerance counseling today. She is 20 y.o. female and has risk factors exercise intolerance including obesity. We discussed intensive lifestyle modifications today with an emphasis on specific weight loss instructions and strategies. Anna Holland will slowly increase activity as tolerated.  Repetitive spaced learning was employed today to elicit superior memory formation and behavioral change.  10. Class 3 severe obesity with serious comorbidity and body mass index (BMI) of 45.0 to 49.9 in adult, unspecified obesity type (HCC)  Anna Holland is currently in the action stage of change and her goal is to continue with weight loss  efforts. I recommend Anna Holland begin the structured treatment plan as follows:  She has agreed to the Category 2 Plan.  She will work on meal planning, intentional eating, and eliminating sugary drinks.  [employer] [place] [address] handout provided.  Reviewed labs from 06/13/2020, including CMP, lipid panel, vitamin D, CBC, TSH.  Exercise goals: No exercise has been prescribed at this time.   Behavioral modification strategies: increasing lean protein intake,  decreasing simple carbohydrates, increasing vegetables, increasing water intake, decreasing eating out, no skipping meals, meal planning and cooking strategies, keeping healthy foods in the home, and planning for success.  She was informed of the importance of frequent follow-up visits to maximize her success with intensive lifestyle modifications for her multiple health conditions. She was informed we would discuss her lab results at her next visit unless there is a critical issue that needs to be addressed sooner. Anna Holland agreed to keep her next visit at the agreed upon time to discuss these results.  Objective:   Blood pressure 120/80, pulse 77, temperature 98.6 F (37 C), height 5' (1.524 m), weight 235 lb (106.6 kg), SpO2 99 %. Body mass index is 45.9 kg/m.  EKG: Normal sinus rhythm, rate 78 bpm.  Indirect Calorimeter completed today shows a VO2 of 221 and a REE of 1526.  Her calculated basal metabolic rate is 3220 thus her basal metabolic rate is worse than expected.  General: Cooperative, alert, well developed, in no acute distress. HEENT: Conjunctivae and lids unremarkable. Cardiovascular: Regular rhythm.  Lungs: Normal work of breathing. Neurologic: No focal deficits.   Lab Results  Component Value Date   CREATININE 0.46 06/13/2020   BUN 9 06/13/2020   NA 137 06/13/2020   K 3.7 06/13/2020   CL 103 06/13/2020   CO2 26 06/13/2020   Lab Results  Component Value Date   ALT 11 06/13/2020   AST 15 06/13/2020   ALKPHOS 106 06/13/2020   BILITOT 0.3 06/13/2020   Lab Results  Component Value Date   TSH 2.94 06/13/2020   Lab Results  Component Value Date   CHOL 163 06/13/2020   HDL 33.30 (L) 06/13/2020   LDLCALC 106 (H) 06/13/2020   LDLDIRECT 126.0 09/02/2017   TRIG 116.0 06/13/2020   CHOLHDL 5 06/13/2020   Lab Results  Component Value Date   WBC 9.5 06/13/2020   HGB 12.5 06/13/2020   HCT 37.3 06/13/2020   MCV 78.7 06/13/2020   PLT 383.0 06/13/2020   Attestation  Statements:   Reviewed by clinician on day of visit: allergies, medications, problem list, medical history, surgical history, family history, social history, and previous encounter notes.  I, Insurance claims handler, CMA, am acting as Energy manager for [employer], DO  I have reviewed the above documentation for accuracy and completeness, and I agree with the above. Corinna Capra, DO

## 2020-12-21 LAB — TSH: TSH: 1.65 u[IU]/mL (ref 0.450–4.500)

## 2020-12-21 LAB — COMPREHENSIVE METABOLIC PANEL
ALT: 12 IU/L (ref 0–32)
AST: 18 IU/L (ref 0–40)
Albumin/Globulin Ratio: 1 — ABNORMAL LOW (ref 1.2–2.2)
Albumin: 3.9 g/dL (ref 3.9–5.0)
Alkaline Phosphatase: 122 IU/L — ABNORMAL HIGH (ref 44–121)
BUN/Creatinine Ratio: 16 (ref 9–23)
BUN: 8 mg/dL (ref 6–20)
Bilirubin Total: <0.2 mg/dL (ref 0.0–1.2)
CO2: 26 mmol/L (ref 20–29)
Calcium: 9.5 mg/dL (ref 8.7–10.2)
Chloride: 98 mmol/L (ref 96–106)
Creatinine, Ser: 0.49 mg/dL — ABNORMAL LOW (ref 0.57–1.00)
Globulin, Total: 3.8 g/dL (ref 1.5–4.5)
Glucose: 74 mg/dL (ref 65–99)
Potassium: 4.5 mmol/L (ref 3.5–5.2)
Sodium: 139 mmol/L (ref 134–144)
Total Protein: 7.7 g/dL (ref 6.0–8.5)
eGFR: 137 mL/min/{1.73_m2} (ref >59)

## 2020-12-21 LAB — T3: T3, Total: 158 ng/dL (ref 71–180)

## 2020-12-21 LAB — HEMOGLOBIN A1C
Est. average glucose Bld gHb Est-mCnc: 108 mg/dL
Hgb A1c MFr Bld: 5.4 % (ref 4.8–5.6)

## 2020-12-21 LAB — LIPID PANEL WITH LDL/HDL RATIO
Cholesterol, Total: 181 mg/dL (ref 100–199)
HDL: 34 mg/dL — ABNORMAL LOW (ref >39)
LDL Chol Calc (NIH): 125 mg/dL — ABNORMAL HIGH (ref 0–99)
LDL/HDL Ratio: 3.7 ratio — ABNORMAL HIGH (ref 0.0–3.2)
Triglycerides: 122 mg/dL (ref 0–149)
VLDL Cholesterol Cal: 22 mg/dL (ref 5–40)

## 2020-12-21 LAB — INSULIN, RANDOM: INSULIN: 23.5 u[IU]/mL (ref 2.6–24.9)

## 2020-12-21 LAB — VITAMIN D 25 HYDROXY (VIT D DEFICIENCY, FRACTURES): Vit D, 25-Hydroxy: 26.2 ng/mL — ABNORMAL LOW (ref 30.0–100.0)

## 2020-12-21 LAB — T4, FREE: Free T4: 1.12 ng/dL (ref 0.82–1.77)

## 2020-12-26 ENCOUNTER — Encounter (INDEPENDENT_AMBULATORY_CARE_PROVIDER_SITE_OTHER): Payer: Self-pay | Admitting: Bariatrics

## 2020-12-26 DIAGNOSIS — E786 Lipoprotein deficiency: Secondary | ICD-10-CM | POA: Insufficient documentation

## 2020-12-26 DIAGNOSIS — E559 Vitamin D deficiency, unspecified: Secondary | ICD-10-CM | POA: Insufficient documentation

## 2021-01-03 ENCOUNTER — Ambulatory Visit (INDEPENDENT_AMBULATORY_CARE_PROVIDER_SITE_OTHER): Payer: BC Managed Care – PPO | Admitting: Bariatrics

## 2021-01-03 ENCOUNTER — Other Ambulatory Visit: Payer: Self-pay

## 2021-01-03 ENCOUNTER — Encounter (INDEPENDENT_AMBULATORY_CARE_PROVIDER_SITE_OTHER): Payer: Self-pay | Admitting: Bariatrics

## 2021-01-03 VITALS — BP 113/84 | HR 78 | Temp 98.2°F | Ht 60.0 in | Wt 229.0 lb

## 2021-01-03 DIAGNOSIS — E8881 Metabolic syndrome: Secondary | ICD-10-CM

## 2021-01-03 DIAGNOSIS — Z9189 Other specified personal risk factors, not elsewhere classified: Secondary | ICD-10-CM | POA: Diagnosis not present

## 2021-01-03 DIAGNOSIS — E786 Lipoprotein deficiency: Secondary | ICD-10-CM | POA: Diagnosis not present

## 2021-01-03 DIAGNOSIS — Z6841 Body Mass Index (BMI) 40.0 and over, adult: Secondary | ICD-10-CM

## 2021-01-03 DIAGNOSIS — E559 Vitamin D deficiency, unspecified: Secondary | ICD-10-CM

## 2021-01-03 NOTE — Progress Notes (Signed)
Chief Complaint:   OBESITY Anna Holland is here to discuss her progress with her obesity treatment plan along with follow-up of her obesity related diagnoses. Anna Holland is on the Category 2 Plan and states she is following her eating plan approximately 100% of the time. Anna Holland states she is walking for 60 minutes 4 times per week.  Today's visit was #: 2 Starting weight: 235 lbs Starting date: 12/20/2020 Today's weight: 229 lbs Today's date: 01/03/2021 Total lbs lost to date: 6 Total lbs lost since last in-office visit: 6  Interim History: Anna Holland is down 6 lbs since her last first visit. She was hungry before dinner at times. She is getting adequate water in.  Subjective:   1. Low HDL (under 40) Anna Holland is not on medications.  2. Vitamin D insufficiency Anna Holland is not on Vit D, and she has a prescription for Vit D at home.  3. Insulin resistance Anna Holland is not on medications. Her insulin level is 23.5 and A1c is 5.4.  4. At risk of diabetes mellitus Anna Holland is at higher than average risk for developing diabetes due to insulin resistance.  Assessment/Plan:   1. Low HDL (under 40) Cardiovascular risk and specific lipid/LDL goals reviewed. We discussed several lifestyle modifications today. Anna Holland will increase her exercise, decrease carbohydrates, and increase MUFA's and PUFA's. Orders and follow up as documented in patient record.   Counseling Intensive lifestyle modifications are the first line treatment for this issue. Dietary changes: Increase soluble fiber. Decrease simple carbohydrates. Exercise changes: Moderate to vigorous-intensity aerobic activity 150 minutes per week if tolerated. Lipid-lowering medications: see documented in medical record.  2. Vitamin D insufficiency Low Vitamin D level contributes to fatigue and are associated with obesity, breast, and colon cancer. Anna Holland will resume prescription Vitamin D high dose and will follow-up for routine testing of Vitamin D,  at least 2-3 times per year to avoid over-replacement.  3. Insulin resistance Anna Holland will continue to work on weight loss, increasing exercise, and decreasing simple carbohydrates to help decrease the risk of diabetes. Anna Holland agreed to follow-up with [place] as directed to closely monitor her progress.  4. At risk of diabetes mellitus Anna Holland was given approximately 15 minutes of diabetes education and counseling today. We discussed intensive lifestyle modifications today with an emphasis on weight loss as well as increasing exercise and decreasing simple carbohydrates in her diet. We also reviewed medication options with an emphasis on risk versus benefit of those discussed.   Repetitive spaced learning was employed today to elicit superior memory formation and behavioral change.  5. Obesity, current BMI 44.8 Anna Holland is currently in the action stage of change. As such, her goal is to continue with weight loss efforts. She has agreed to the Category 2 Plan.   Intentional eating was discussed. I discussed labs from 12/20/2020 with the patient today.   Exercise goals: As is.  Behavioral modification strategies: increasing lean protein intake, decreasing simple carbohydrates, increasing vegetables, increasing water intake, decreasing eating out, no skipping meals, meal planning and cooking strategies, keeping healthy foods in the home, and planning for success.  Anna Holland has agreed to follow-up with our clinic in 2 weeks. She was informed of the importance of frequent follow-up visits to maximize her success with intensive lifestyle modifications for her multiple health conditions.   Objective:   Blood pressure 113/84, pulse 78, temperature 98.2 F (36.8 C), height 5' (1.524 m), weight 229 lb (103.9 kg), SpO2 97 %. Body mass index is 44.72 kg/m.  General: Cooperative, alert, well developed, in no acute distress. HEENT: Conjunctivae and lids unremarkable. Cardiovascular: Regular rhythm.  Lungs:  Normal work of breathing. Neurologic: No focal deficits.   Lab Results  Component Value Date   CREATININE 0.49 (L) 12/20/2020   BUN 8 12/20/2020   NA 139 12/20/2020   K 4.5 12/20/2020   CL 98 12/20/2020   CO2 26 12/20/2020   Lab Results  Component Value Date   ALT 12 12/20/2020   AST 18 12/20/2020   ALKPHOS 122 (H) 12/20/2020   BILITOT <0.2 12/20/2020   Lab Results  Component Value Date   HGBA1C 5.4 12/20/2020   Lab Results  Component Value Date   INSULIN 23.5 12/20/2020   Lab Results  Component Value Date   TSH 1.650 12/20/2020   Lab Results  Component Value Date   CHOL 181 12/20/2020   HDL 34 (L) 12/20/2020   LDLCALC 125 (H) 12/20/2020   LDLDIRECT 126.0 09/02/2017   TRIG 122 12/20/2020   CHOLHDL 5 06/13/2020   Lab Results  Component Value Date   VD25OH 26.2 (L) 12/20/2020   VD25OH 19.70 (L) 06/13/2020   VD25OH 12.68 (L) 09/02/2017   Lab Results  Component Value Date   WBC 9.5 06/13/2020   HGB 12.5 06/13/2020   HCT 37.3 06/13/2020   MCV 78.7 06/13/2020   PLT 383.0 06/13/2020   No results found for: IRON, TIBC, FERRITIN  Attestation Statements:   Reviewed by clinician on day of visit: allergies, medications, problem list, medical history, surgical history, family history, social history, and previous encounter notes.   Trude Mcburney, am acting as Energy manager for [employer], DO.  I have reviewed the above documentation for accuracy and completeness, and I agree with the above. Corinna Capra, DO

## 2021-01-28 ENCOUNTER — Ambulatory Visit (INDEPENDENT_AMBULATORY_CARE_PROVIDER_SITE_OTHER): Payer: BC Managed Care – PPO | Admitting: Physician Assistant

## 2021-01-31 ENCOUNTER — Other Ambulatory Visit: Payer: Self-pay

## 2021-01-31 ENCOUNTER — Encounter (INDEPENDENT_AMBULATORY_CARE_PROVIDER_SITE_OTHER): Payer: Self-pay | Admitting: Physician Assistant

## 2021-01-31 ENCOUNTER — Ambulatory Visit (INDEPENDENT_AMBULATORY_CARE_PROVIDER_SITE_OTHER): Payer: BC Managed Care – PPO | Admitting: Physician Assistant

## 2021-01-31 VITALS — BP 85/51 | HR 85 | Temp 99.0°F | Ht 60.0 in | Wt 224.0 lb

## 2021-01-31 DIAGNOSIS — Z6841 Body Mass Index (BMI) 40.0 and over, adult: Secondary | ICD-10-CM | POA: Diagnosis not present

## 2021-01-31 DIAGNOSIS — E559 Vitamin D deficiency, unspecified: Secondary | ICD-10-CM

## 2021-01-31 MED ORDER — VITAMIN D (ERGOCALCIFEROL) 1.25 MG (50000 UNIT) PO CAPS: 50000.0000 [IU] | ORAL_CAPSULE | ORAL | 0 refills | Status: DC

## 2021-01-31 NOTE — Progress Notes (Signed)
Chief Complaint:   OBESITY Jenniefer is here to discuss her progress with her obesity treatment plan along with follow-up of her obesity related diagnoses. Levette is on the Category 2 Plan and states she is following her eating plan approximately 90-95% of the time. Orvilla states she is walking the dog and going to the gym 30-40 minutes 4-5 times per week.  Today's visit was #: 3 Starting weight: 235 lbs Starting date: 12/20/2020 Today's weight: 224 lbs Today's date: 01/31/2021 Total lbs lost to date: 11 Total lbs lost since last in-office visit: 5  Interim History: Myrikal did well with weight loss. She is bored with lunch. She tends to feel hungrier when she is stressed and has been overeating her snack calories by 100 calories on those days.  Subjective:   1. Vitamin D insufficiency Delisha is on Vit D from her PCP. She take a weekly dose and OTC daily dose. She is unsure of OTC dose.  Assessment/Plan:   1. Vitamin D insufficiency Low Vitamin D level contributes to fatigue and are associated with obesity, breast, and colon cancer. She agrees to continue to take prescription Vitamin D 50,000 IU every week and OTC Vit D daily. She will follow-up for routine testing of Vitamin D, at least 2-3 times per year to avoid over-replacement.  Refill- Vitamin D, Ergocalciferol, (DRISDOL) 1.25 MG (50000 UNIT) CAPS capsule; Take 1 capsule (50,000 Units total) by mouth every 7 (seven) days.  Dispense: 5 capsule; Refill: 0  2. Obesity, current BMI 43.75  Dareth is currently in the action stage of change. As such, her goal is to continue with weight loss efforts. She has agreed to the Category 2 Plan.   Exercise goals:  As is  Behavioral modification strategies: meal planning and cooking strategies and planning for success.  Philicia has agreed to follow-up with our clinic in 2 weeks. She was informed of the importance of frequent follow-up visits to maximize her success with intensive lifestyle  modifications for her multiple health conditions.   Objective:   Blood pressure (!) 85/51, pulse 85, temperature 99 F (37.2 C), height 5' (1.524 m), weight 224 lb (101.6 kg), SpO2 98 %. Body mass index is 43.75 kg/m.  General: Cooperative, alert, well developed, in no acute distress. HEENT: Conjunctivae and lids unremarkable. Cardiovascular: Regular rhythm.  Lungs: Normal work of breathing. Neurologic: No focal deficits.   Lab Results  Component Value Date   CREATININE 0.49 (L) 12/20/2020   BUN 8 12/20/2020   NA 139 12/20/2020   K 4.5 12/20/2020   CL 98 12/20/2020   CO2 26 12/20/2020   Lab Results  Component Value Date   ALT 12 12/20/2020   AST 18 12/20/2020   ALKPHOS 122 (H) 12/20/2020   BILITOT <0.2 12/20/2020   Lab Results  Component Value Date   HGBA1C 5.4 12/20/2020   Lab Results  Component Value Date   INSULIN 23.5 12/20/2020   Lab Results  Component Value Date   TSH 1.650 12/20/2020   Lab Results  Component Value Date   CHOL 181 12/20/2020   HDL 34 (L) 12/20/2020   LDLCALC 125 (H) 12/20/2020   LDLDIRECT 126.0 09/02/2017   TRIG 122 12/20/2020   CHOLHDL 5 06/13/2020   Lab Results  Component Value Date   VD25OH 26.2 (L) 12/20/2020   VD25OH 19.70 (L) 06/13/2020   VD25OH 12.68 (L) 09/02/2017   Lab Results  Component Value Date   WBC 9.5 06/13/2020   HGB 12.5  06/13/2020   HCT 37.3 06/13/2020   MCV 78.7 06/13/2020   PLT 383.0 06/13/2020    Attestation Statements:   Reviewed by clinician on day of visit: allergies, medications, problem list, medical history, surgical history, family history, social history, and previous encounter notes.  Time spent on visit including pre-visit chart review and post-visit care and charting was 30 minutes.   Edmund Hilda, CMA, am acting as transcriptionist for [employer], PA-C.  I have reviewed the above documentation for accuracy and completeness, and I agree with the above. Alois Cliche,  PA-C

## 2021-02-11 ENCOUNTER — Encounter (INDEPENDENT_AMBULATORY_CARE_PROVIDER_SITE_OTHER): Payer: Self-pay | Admitting: Family Medicine

## 2021-02-11 ENCOUNTER — Other Ambulatory Visit: Payer: Self-pay

## 2021-02-11 ENCOUNTER — Ambulatory Visit (INDEPENDENT_AMBULATORY_CARE_PROVIDER_SITE_OTHER): Payer: BC Managed Care – PPO | Admitting: Family Medicine

## 2021-02-11 VITALS — BP 109/76 | HR 80 | Temp 98.1°F | Ht 60.0 in | Wt 223.0 lb

## 2021-02-11 DIAGNOSIS — Z9189 Other specified personal risk factors, not elsewhere classified: Secondary | ICD-10-CM | POA: Diagnosis not present

## 2021-02-11 DIAGNOSIS — K5909 Other constipation: Secondary | ICD-10-CM | POA: Diagnosis not present

## 2021-02-11 DIAGNOSIS — K219 Gastro-esophageal reflux disease without esophagitis: Secondary | ICD-10-CM | POA: Diagnosis not present

## 2021-02-11 DIAGNOSIS — E559 Vitamin D deficiency, unspecified: Secondary | ICD-10-CM | POA: Diagnosis not present

## 2021-02-11 DIAGNOSIS — Z6841 Body Mass Index (BMI) 40.0 and over, adult: Secondary | ICD-10-CM

## 2021-02-11 MED ORDER — POLYETHYLENE GLYCOL 3350 17 G PO PACK: 17.0000 g | PACK | Freq: Two times a day (BID) | ORAL | 0 refills | Status: AC

## 2021-02-11 NOTE — Progress Notes (Signed)
Chief Complaint:   OBESITY Anna Holland is here to discuss her progress with her obesity treatment plan along with follow-up of her obesity related diagnoses. Anna Holland is on the Category 2 Plan and states she is following her eating plan approximately 90-95% of the time. Anna Holland states she is walking 30 minutes 4-5 times per week.  Today's visit was #: 4 Starting weight: 235 lbs Starting date: 12/20/2020 Today's weight: 223 lbs Today's date: 02/11/2021 Total lbs lost to date: 12 Total lbs lost since last in-office visit: 1  Interim History: Anna Holland reports things are going well. This is her first OV with me. She skips breakfast occasionally due to 8 AM classes. She has a cliff bar that is around 250-300 calories and unknown amount of protein.  Subjective:   1. Vitamin D insufficiency Anna Holland forgets to take supplement weekly. She denies issues with use.  2. Other constipation Pt reports some symptoms this past week and weekend. She took a gas-ex.  Pt drinks 24 oz water 4 times a day.  3. Gastroesophageal reflux disease, unspecified whether esophagitis present Pt has a history of EGD; had [place] in the past. Pt's symptoms are better controlled on meal plan. She has no current issues. Per PCP, pt is on pantoprazole QD.  4. At risk for deficient intake of food The patient is at a higher than average risk of deficient intake of food due to poor intake.  Assessment/Plan:  No orders of the defined types were placed in this encounter.   There are no discontinued medications.   Meds ordered this encounter  Medications   polyethylene glycol (MIRALAX / GLYCOLAX) 17 g packet    Sig: Take 17 g by mouth 2 (two) times daily. Until stooling regularly    Dispense:  60 packet    Refill:  0     1. Vitamin D insufficiency Low Vitamin D level contributes to fatigue and are associated with obesity, breast, and colon cancer. She agrees to take prescription Vitamin D 50,000 IU on a set day of the week,  i.e. Sunday and will follow-up for routine testing of Vitamin D, at least 2-3 times per year to avoid over-replacement. Pt declines need for refill today.  2. Other constipation CALM suppositories- start magnesium 325 mg QHS and prn Miralax QD. Continue drinking 100 oz of water per day and increase fiber by following meal plan. Education provided.   Refill- polyethylene glycol (MIRALAX / GLYCOLAX) 17 g packet; Take 17 g by mouth 2 (two) times daily. Until stooling regularly  Dispense: 60 packet; Refill: 0  3. Gastroesophageal reflux disease, unspecified whether esophagitis present Intensive lifestyle modifications are the first line treatment for this issue. We discussed several lifestyle modifications today and she will continue to work on diet, exercise and weight loss efforts. Orders and follow up as documented in patient record.   Counseling If a person has gastroesophageal reflux disease (GERD), food and stomach acid move back up into the esophagus and cause symptoms or problems such as damage to the esophagus. Anti-reflux measures include: raising the head of the bed, avoiding tight clothing or belts, avoiding eating late at night, not lying down shortly after mealtime, and achieving weight loss. Avoid ASA, NSAID's, caffeine, alcohol, and tobacco.  OTC Pepcid and/or Tums are often very helpful for as needed use.  However, for persisting chronic or daily symptoms, stronger medications like Omeprazole may be needed. You may need to avoid foods and drinks such as: Coffee and tea (with  or without caffeine). Drinks that contain alcohol. Energy drinks and sports drinks. Bubbly (carbonated) drinks or sodas. Chocolate and cocoa. Peppermint and mint flavorings. Garlic and onions. Horseradish. Spicy and acidic foods. These include peppers, chili powder, curry powder, vinegar, hot sauces, and BBQ sauce. Citrus fruit juices and citrus fruits, such as oranges, lemons, and limes. Tomato-based  foods. These include red sauce, chili, salsa, and pizza with red sauce. Fried and fatty foods. These include donuts, french fries, potato chips, and high-fat dressings. High-fat meats. These include hot dogs, rib eye steak, sausage, ham, and bacon.  4. At risk for deficient intake of food Anna Holland was given approximately 9 minutes of deficit intake of food prevention counseling today. Anna Holland is at risk for eating too few calories based on current food recall. She was encouraged to focus on meeting caloric and protein goals according to her recommended meal plan.   5. Obesity with current BMI of 43.6  Anna Holland is currently in the action stage of change. As such, her goal is to continue with weight loss efforts. She has agreed to the Category 2 Plan and keeping a food journal and adhering to recommended goals of 200-300 calories and 20 grams protein with breakfast.   Ideas for breakfast reviewed with pt.  Exercise goals:  As is  Behavioral modification strategies: meal planning and cooking strategies and planning for success.  Anna Holland has agreed to follow-up with our clinic in 2-3 weeks- December 1st, per pt request. She was informed of the importance of frequent follow-up visits to maximize her success with intensive lifestyle modifications for her multiple health conditions.   Objective:   Blood pressure 109/76, pulse 80, temperature 98.1 F (36.7 C), height 5' (1.524 m), weight 223 lb (101.2 kg), SpO2 97 %. Body mass index is 43.55 kg/m.  General: Cooperative, alert, well developed, in no acute distress. HEENT: Conjunctivae and lids unremarkable. Cardiovascular: Regular rhythm.  Lungs: Normal work of breathing. Neurologic: No focal deficits.   Lab Results  Component Value Date   CREATININE 0.49 (L) 12/20/2020   BUN 8 12/20/2020   NA 139 12/20/2020   K 4.5 12/20/2020   CL 98 12/20/2020   CO2 26 12/20/2020   Lab Results  Component Value Date   ALT 12 12/20/2020   AST 18  12/20/2020   ALKPHOS 122 (H) 12/20/2020   BILITOT <0.2 12/20/2020   Lab Results  Component Value Date   HGBA1C 5.4 12/20/2020   Lab Results  Component Value Date   INSULIN 23.5 12/20/2020   Lab Results  Component Value Date   TSH 1.650 12/20/2020   Lab Results  Component Value Date   CHOL 181 12/20/2020   HDL 34 (L) 12/20/2020   LDLCALC 125 (H) 12/20/2020   LDLDIRECT 126.0 09/02/2017   TRIG 122 12/20/2020   CHOLHDL 5 06/13/2020   Lab Results  Component Value Date   VD25OH 26.2 (L) 12/20/2020   VD25OH 19.70 (L) 06/13/2020   VD25OH 12.68 (L) 09/02/2017   Lab Results  Component Value Date   WBC 9.5 06/13/2020   HGB 12.5 06/13/2020   HCT 37.3 06/13/2020   MCV 78.7 06/13/2020   PLT 383.0 06/13/2020    Attestation Statements:   Reviewed by clinician on day of visit: allergies, medications, problem list, medical history, surgical history, family history, social history, and previous encounter notes.  Edmund Hilda, CMA, am acting as transcriptionist for [employer], DO.  I have reviewed the above documentation for accuracy and completeness, and I  agree with the above. Marjory Sneddon, D.O.  The [place] was signed into law in 2016 which includes the topic of electronic health records.  This provides immediate access to information in MyChart.  This includes consultation notes, operative notes, office notes, lab results and pathology reports.  If you have any questions about what you read please let us know at your next visit so we can discuss your concerns and take corrective action if need be.  We are right here with you.

## 2021-03-07 ENCOUNTER — Other Ambulatory Visit: Payer: Self-pay

## 2021-03-07 ENCOUNTER — Encounter (INDEPENDENT_AMBULATORY_CARE_PROVIDER_SITE_OTHER): Payer: Self-pay | Admitting: Family Medicine

## 2021-03-07 ENCOUNTER — Ambulatory Visit (INDEPENDENT_AMBULATORY_CARE_PROVIDER_SITE_OTHER): Payer: BC Managed Care – PPO | Admitting: Family Medicine

## 2021-03-07 VITALS — BP 118/70 | HR 79 | Temp 98.8°F | Ht 60.0 in | Wt 223.0 lb

## 2021-03-07 DIAGNOSIS — E8881 Metabolic syndrome: Secondary | ICD-10-CM | POA: Diagnosis not present

## 2021-03-07 DIAGNOSIS — E559 Vitamin D deficiency, unspecified: Secondary | ICD-10-CM

## 2021-03-07 DIAGNOSIS — F39 Unspecified mood [affective] disorder: Secondary | ICD-10-CM | POA: Diagnosis not present

## 2021-03-07 DIAGNOSIS — Z9189 Other specified personal risk factors, not elsewhere classified: Secondary | ICD-10-CM | POA: Diagnosis not present

## 2021-03-07 DIAGNOSIS — K5909 Other constipation: Secondary | ICD-10-CM

## 2021-03-07 DIAGNOSIS — Z6841 Body Mass Index (BMI) 40.0 and over, adult: Secondary | ICD-10-CM

## 2021-03-07 MED ORDER — VITAMIN D (ERGOCALCIFEROL) 1.25 MG (50000 UNIT) PO CAPS: 50000.0000 [IU] | ORAL_CAPSULE | ORAL | 0 refills | Status: DC

## 2021-03-12 NOTE — Progress Notes (Signed)
Chief Complaint:   OBESITY Anna Holland is here to discuss her progress with her obesity treatment plan along with follow-up of her obesity related diagnoses. Anna Holland is on the Category 2 Plan and keeping a food journal and adhering to recommended goals of 200-300 calories and 20 grams protein at breakfast and states she is following her eating plan approximately 90% of the time. Anna Holland states she is walking the dog 30 minutes 3 times per week.  Today's visit was #: 5 Starting weight: 235 lbs Starting date: 12/20/2020 Today's weight: 223 lbs Today's date: 03/07/2021 Total lbs lost to date: 12 Total lbs lost since last in-office visit: 0  Interim History: Anna Holland was in [place] for the holidays. She is glad she didn't gain and enjoyed Thanksgiving. Pt is back at the meal plan already. She has no issues with plan and overall is doing very well with her weight loss journey.    Subjective:   1. Insulin resistance Giulia has a diagnosis of insulin resistance based on her elevated fasting insulin level >5. She continues to work on diet and exercise to decrease her risk of diabetes. [name] reports ome carb cravings and notes it is difficult to resist once she starts eating them.  2. Vitamin D insufficiency She is currently taking prescription vitamin D 50,000 IU each week. She denies nausea, vomiting or muscle weakness.  3. Mood disorder (HCC) Mood stable. Pt mostly with generalized anxiety disorder but also significant depression at times. She goes to [employer].  Medication: Celexa, Wellbutrin  4. Other constipation Ilo started CALM suppositories and prn Miralax at last OV. Symptoms are improving and no concerns today. She is getting approximately 100 water per day and doing much better with that.  5. At risk for impaired metabolic function [name] is at increased risk for impaired metabolic function due to insulin resistance and obesity.    Assessment/Plan:  No orders of the defined types  were placed in this encounter.   Medications Discontinued During This Encounter  Medication Reason   Vitamin D, Ergocalciferol, (DRISDOL) 1.25 MG (50000 UNIT) CAPS capsule Reorder     Meds ordered this encounter  Medications   Vitamin D, Ergocalciferol, (DRISDOL) 1.25 MG (50000 UNIT) CAPS capsule    Sig: Take 1 capsule (50,000 Units total) by mouth every 7 (seven) days.    Dispense:  5 capsule    Refill:  0     1. Insulin resistance Anna Holland will continue to work on weight loss, exercise, and decreasing simple carbohydrates to help decrease the risk of diabetes. Anna Holland agreed to follow-up with [place] as directed to closely monitor her progress. Extensive education done on disease process once again.  Handouts given on insulin resistance and on Metformin.  Declines metformin today but pt will think about starting the medication and read information for next OV when we can discuss further.  2. Vitamin D insufficiency Level not at goal.  Low Vitamin D level contributes to fatigue and are associated with obesity, breast, and colon cancer. She agrees to continue to take prescription Vitamin D 50,000 IU every week and will follow-up for routine testing of Vitamin D, at least 2-3 times per year to avoid over-replacement.  Refill- Vitamin D, Ergocalciferol, (DRISDOL) 1.25 MG (50000 UNIT) CAPS capsule; Take 1 capsule (50,000 Units total) by mouth every 7 (seven) days.  Dispense: 5 capsule; Refill: 0  3. Mood disorder (HCC) Pt's mood is stable, but she wonders if she needs further evaluation for ADHD or other  mood disorders, such as bipolar and the like.  - Pt will contact PCP regarding psychiatry referral and possibly counseling referral.  - Pt encouraged to see resources on campus for ADD evaluation and counseling, which both may only have a nominal fee.  4. Other constipation Resolved. Continue current treatment regimen of CALM suppositories and prn Miralax. Increase water intake and walking.  5.  At risk for impaired metabolic function Due to [name]'s current state of health and medical condition(s), she is at a significantly higher risk for impaired metabolic function.   At least 9 minutes was spent on counseling Anna Holland about these concerns today.  This places the patient at a much greater risk to subsequently develop cardio-pulmonary conditions that can negatively affect the patient's quality of life.  I stressed the importance of reversing these risks factors.  The initial goal is to lose at least 5-10% of starting weight to help reduce risk factors.  Counseling:  Intensive lifestyle modifications discussed with [name] as the most appropriate first line treatment.  she will continue to work on diet, exercise, and weight loss efforts.  We will continue to reassess these conditions on a fairly regular basis in an attempt to decrease the patient's overall morbidity and mortality.  6. Obesity BMI today is 9  Anna Holland is currently in the action stage of change. As such, her goal is to continue with weight loss efforts. She has agreed to the Category 2 Plan and keeping a food journal and adhering to recommended goals of 200-300 calories and 20 grams protein with breakfast.   Exercise goals:  Increase as tolerated.  Behavioral modification strategies: increasing lean protein intake, increasing water intake, and keeping healthy foods in the home.  Anna Holland has agreed to follow-up with our clinic in 2 weeks. She was informed of the importance of frequent follow-up visits to maximize her success with intensive lifestyle modifications for her multiple health conditions.   Objective:   Blood pressure 118/70, pulse 79, temperature 98.8 F (37.1 C), height 5' (1.524 m), weight 223 lb (101.2 kg), SpO2 97 %. Body mass index is 43.55 kg/m.  General: Cooperative, alert, well developed, in no acute distress. HEENT: Conjunctivae and lids unremarkable. Cardiovascular: Regular rhythm.  Lungs: Normal work of  breathing. Neurologic: No focal deficits.   Lab Results  Component Value Date   CREATININE 0.49 (L) 12/20/2020   BUN 8 12/20/2020   NA 139 12/20/2020   K 4.5 12/20/2020   CL 98 12/20/2020   CO2 26 12/20/2020   Lab Results  Component Value Date   ALT 12 12/20/2020   AST 18 12/20/2020   ALKPHOS 122 (H) 12/20/2020   BILITOT <0.2 12/20/2020   Lab Results  Component Value Date   HGBA1C 5.4 12/20/2020   Lab Results  Component Value Date   INSULIN 23.5 12/20/2020   Lab Results  Component Value Date   TSH 1.650 12/20/2020   Lab Results  Component Value Date   CHOL 181 12/20/2020   HDL 34 (L) 12/20/2020   LDLCALC 125 (H) 12/20/2020   LDLDIRECT 126.0 09/02/2017   TRIG 122 12/20/2020   CHOLHDL 5 06/13/2020   Lab Results  Component Value Date   VD25OH 26.2 (L) 12/20/2020   VD25OH 19.70 (L) 06/13/2020   VD25OH 12.68 (L) 09/02/2017   Lab Results  Component Value Date   WBC 9.5 06/13/2020   HGB 12.5 06/13/2020   HCT 37.3 06/13/2020   MCV 78.7 06/13/2020   PLT 383.0 06/13/2020   Attestation  Statements:   Reviewed by clinician on day of visit: allergies, medications, problem list, medical history, surgical history, family history, social history, and previous encounter notes.  Coral Ceo, CMA, am acting as transcriptionist for [employer], DO.  I have reviewed the above documentation for accuracy and completeness, and I agree with the above. Marjory Sneddon, D.O.  The [place] was signed into law in 2016 which includes the topic of electronic health records.  This provides immediate access to information in MyChart.  This includes consultation notes, operative notes, office notes, lab results and pathology reports.  If you have any questions about what you read please let us know at your next visit so we can discuss your concerns and take corrective action if need be.  We are right here with you.

## 2021-03-21 ENCOUNTER — Ambulatory Visit (INDEPENDENT_AMBULATORY_CARE_PROVIDER_SITE_OTHER): Payer: BC Managed Care – PPO | Admitting: Family Medicine

## 2021-03-21 ENCOUNTER — Encounter (INDEPENDENT_AMBULATORY_CARE_PROVIDER_SITE_OTHER): Payer: Self-pay | Admitting: Family Medicine

## 2021-03-21 ENCOUNTER — Other Ambulatory Visit: Payer: Self-pay

## 2021-03-21 VITALS — BP 120/60 | HR 61 | Temp 98.1°F | Ht 60.0 in | Wt 219.0 lb

## 2021-03-21 DIAGNOSIS — Z6841 Body Mass Index (BMI) 40.0 and over, adult: Secondary | ICD-10-CM | POA: Diagnosis not present

## 2021-03-21 DIAGNOSIS — E559 Vitamin D deficiency, unspecified: Secondary | ICD-10-CM | POA: Diagnosis not present

## 2021-03-21 DIAGNOSIS — Z9189 Other specified personal risk factors, not elsewhere classified: Secondary | ICD-10-CM | POA: Diagnosis not present

## 2021-03-21 MED ORDER — VITAMIN D (ERGOCALCIFEROL) 1.25 MG (50000 UNIT) PO CAPS: 50000.0000 [IU] | ORAL_CAPSULE | ORAL | 0 refills | Status: DC

## 2021-03-25 NOTE — Progress Notes (Signed)
Chief Complaint:   OBESITY Anna Holland is here to discuss her progress with her obesity treatment plan along with follow-up of her obesity related diagnoses. Anna Holland is on the Category 2 Plan and keeping a food journal and adhering to recommended goals of 200-300 calories and 20 grams of protein at breakfast daily and states she is following her eating plan approximately 90% of the time. Anna Holland states she is walking the dog for 30 minutes 3-4 times per week.  Today's visit was #: 6 Starting weight: 235 lbs Starting date: 12/20/2020 Today's weight: 219 lbs Today's date: 03/21/2021 Total lbs lost to date: 16 Total lbs lost since last in-office visit: 4  Interim History: Anna Holland is here for a follow up office visit. We reviewed her meal plan and questions were answered. Patient's food recall appears to be accurate and consistent with what is on plan when she is following it. When eating on plan, her hunger and cravings are well controlled. She has no concerns with the upcoming holidays. She is focused.    Subjective:   1. Vitamin D insufficiency Anna Holland is currently taking prescription vitamin D 50,000 IU each week. She denies nausea, vomiting or muscle weakness.  2. At risk for dehydration Anna Holland is at risk for dehydration due to increased protein in her diet and starting to increase exercise.  Assessment/Plan:  No orders of the defined types were placed in this encounter.   Medications Discontinued During This Encounter  Medication Reason   Vitamin D, Ergocalciferol, (DRISDOL) 1.25 MG (50000 UNIT) CAPS capsule Reorder     Meds ordered this encounter  Medications   Vitamin D, Ergocalciferol, (DRISDOL) 1.25 MG (50000 UNIT) CAPS capsule    Sig: Take 1 capsule (50,000 Units total) by mouth every 7 (seven) days.    Dispense:  5 capsule    Refill:  0     1. Vitamin D insufficiency Low Vitamin D level contributes to fatigue and are associated with obesity, breast, and colon  cancer. We will refill prescription Vitamin D for 1 month. Anna Holland will follow-up for routine testing of Vitamin D, at least 2-3 times per year to avoid over-replacement.  - Vitamin D, Ergocalciferol, (DRISDOL) 1.25 MG (50000 UNIT) CAPS capsule; Take 1 capsule (50,000 Units total) by mouth every 7 (seven) days.  Dispense: 5 capsule; Refill: 0  2. At risk for dehydration Anna Holland was given approximately 9 minutes dehydration prevention counseling today. Anna Holland is at risk for dehydration due to weight loss and current medication(s). She was encouraged to hydrate and monitor fluid status to avoid dehydration as well as weight loss plateaus.   3. Obesity with current BMI of 42.8 Anna Holland is currently in the action stage of change. As such, her goal is to continue with weight loss efforts. She has agreed to the Category 2 Plan and keeping a food journal and adhering to recommended goals of 200-300 calories and 20 grams of protein at breakfast daily.   Anna Holland plan on getting back into the gym and increasing her activity over the holidays.  Exercise goals: As is.  Behavioral modification strategies: increasing lean protein intake, increasing water intake, avoiding temptations, and planning for success.  Anna Holland has agreed to follow-up with our clinic in 3 weeks. She was informed of the importance of frequent follow-up visits to maximize her success with intensive lifestyle modifications for her multiple health conditions.   Objective:   Blood pressure 120/60, pulse 61, temperature 98.1 F (36.7 C), height 5' (1.524 m),  weight 219 lb (99.3 kg), SpO2 99 %. Body mass index is 42.77 kg/m.  General: Cooperative, alert, well developed, in no acute distress. HEENT: Conjunctivae and lids unremarkable. Cardiovascular: Regular rhythm.  Lungs: Normal work of breathing. Neurologic: No focal deficits.   Lab Results  Component Value Date   CREATININE 0.49 (L) 12/20/2020   BUN 8 12/20/2020   NA 139 12/20/2020    K 4.5 12/20/2020   CL 98 12/20/2020   CO2 26 12/20/2020   Lab Results  Component Value Date   ALT 12 12/20/2020   AST 18 12/20/2020   ALKPHOS 122 (H) 12/20/2020   BILITOT <0.2 12/20/2020   Lab Results  Component Value Date   HGBA1C 5.4 12/20/2020   Lab Results  Component Value Date   INSULIN 23.5 12/20/2020   Lab Results  Component Value Date   TSH 1.650 12/20/2020   Lab Results  Component Value Date   CHOL 181 12/20/2020   HDL 34 (L) 12/20/2020   LDLCALC 125 (H) 12/20/2020   LDLDIRECT 126.0 09/02/2017   TRIG 122 12/20/2020   CHOLHDL 5 06/13/2020   Lab Results  Component Value Date   VD25OH 26.2 (L) 12/20/2020   VD25OH 19.70 (L) 06/13/2020   VD25OH 12.68 (L) 09/02/2017   Lab Results  Component Value Date   WBC 9.5 06/13/2020   HGB 12.5 06/13/2020   HCT 37.3 06/13/2020   MCV 78.7 06/13/2020   PLT 383.0 06/13/2020   No results found for: IRON, TIBC, FERRITIN  Attestation Statements:   Reviewed by clinician on day of visit: allergies, medications, problem list, medical history, surgical history, family history, social history, and previous encounter notes.   Trude Mcburney, am acting as transcriptionist for [employer], DO.  I have reviewed the above documentation for accuracy and completeness, and I agree with the above. Carlye Grippe, D.O.  The 21st Century Cures Act was signed into law in 2016 which includes the topic of electronic health records.  This provides immediate access to information in MyChart.  This includes consultation notes, operative notes, office notes, lab results and pathology reports.  If you have any questions about what you read please let us know at your next visit so we can discuss your concerns and take corrective action if need be.  We are right here with you.

## 2021-04-17 ENCOUNTER — Other Ambulatory Visit: Payer: Self-pay

## 2021-04-17 ENCOUNTER — Encounter (INDEPENDENT_AMBULATORY_CARE_PROVIDER_SITE_OTHER): Payer: Self-pay | Admitting: Physician Assistant

## 2021-04-17 ENCOUNTER — Ambulatory Visit (INDEPENDENT_AMBULATORY_CARE_PROVIDER_SITE_OTHER): Payer: BC Managed Care – PPO | Admitting: Physician Assistant

## 2021-04-17 VITALS — BP 118/66 | HR 72 | Temp 98.1°F | Ht 60.0 in | Wt 217.0 lb

## 2021-04-17 DIAGNOSIS — Z6841 Body Mass Index (BMI) 40.0 and over, adult: Secondary | ICD-10-CM

## 2021-04-17 DIAGNOSIS — E669 Obesity, unspecified: Secondary | ICD-10-CM | POA: Diagnosis not present

## 2021-04-17 DIAGNOSIS — E786 Lipoprotein deficiency: Secondary | ICD-10-CM

## 2021-04-17 DIAGNOSIS — Z9189 Other specified personal risk factors, not elsewhere classified: Secondary | ICD-10-CM

## 2021-04-17 DIAGNOSIS — E559 Vitamin D deficiency, unspecified: Secondary | ICD-10-CM

## 2021-04-17 DIAGNOSIS — R7309 Other abnormal glucose: Secondary | ICD-10-CM | POA: Diagnosis not present

## 2021-04-17 MED ORDER — VITAMIN D (ERGOCALCIFEROL) 1.25 MG (50000 UNIT) PO CAPS: 50000.0000 [IU] | ORAL_CAPSULE | ORAL | 0 refills | Status: DC

## 2021-04-17 NOTE — Progress Notes (Signed)
Chief Complaint:   OBESITY Anna Holland is here to discuss her progress with her obesity treatment plan along with follow-up of her obesity related diagnoses. Anna Holland is on the Category 2 Plan and keeping a food journal and adhering to recommended goals of 200-300 calories and 20 grams of protein at breakfast daily and states she is following her eating plan approximately 85% of the time. Anna Holland states she is walking for 30 minutes 4 times per week.  Today's visit was #: 7 Starting weight: 235 lbs Starting date: 12/20/2020 Today's weight: 217 lbs Today's date: 04/17/2021 Total lbs lost to date: 18 Total lbs lost since last in-office visit: 2  Interim History: Anna Holland did well with weight loss. She is enjoying her Category 2 for the most part. She denies excessive hunger. Sometimes she will drink a protein shake for breakfast and she is hungrier sooner than normal. We discussed strategies for eating with her friends.  Subjective:   1. Vitamin D insufficiency Anna Holland is on Vit D weekly, and she is tolerating it well. Last Vit D level was 26.2.  2. Elevated glucose Anna Holland's last insulin level was 23.5, and A1c of 5.4.  3. Low HDL (under 40) Anna Holland is walking 4 times per week. She is not on medications currently.  4. At risk of diabetes mellitus Anna Holland is at higher than average risk for developing diabetes due to obesity.   Assessment/Plan:   1. Vitamin D insufficiency We will check labs today. We will refill prescription Vitamin D for 1 month. Anna Holland will follow-up for routine testing of Vitamin D, at least 2-3 times per year to avoid over-replacement.  - VITAMIN D 25 Hydroxy (Vit-D Deficiency, Fractures) - Vitamin D, Ergocalciferol, (DRISDOL) 1.25 MG (50000 UNIT) CAPS capsule; Take 1 capsule (50,000 Units total) by mouth every 7 (seven) days.  Dispense: 5 capsule; Refill: 0  2. Elevated glucose We will check labs today, and will follow up at Anna Holland's next office visit.  -  Comprehensive metabolic panel - Hemoglobin A1c - Insulin, random  3. Low HDL (under 40) Cardiovascular risk and specific lipid/HDL goals reviewed.  We discussed several lifestyle modifications today. Anna Holland will continue with diet, exercise and weight loss efforts. We will check labs today. Orders and follow up as documented in patient record.   Counseling Intensive lifestyle modifications are the first line treatment for this issue. Dietary changes: Increase soluble fiber. Decrease simple carbohydrates. Exercise changes: Moderate to vigorous-intensity aerobic activity 150 minutes per week if tolerated. Lipid-lowering medications: see documented in medical record.  - Lipid panel  4. At risk of diabetes mellitus Anna Holland was given approximately 15 minutes of diabetes education and counseling today. We discussed intensive lifestyle modifications today with an emphasis on weight loss as well as increasing exercise and decreasing simple carbohydrates in her diet. We also reviewed medication options with an emphasis on risk versus benefit of those discussed.   Repetitive spaced learning was employed today to elicit superior memory formation and behavioral change.  5. Obesity with current BMI of 42.8 Anna Holland is currently in the action stage of change. As such, her goal is to continue with weight loss efforts. She has agreed to the Category 2 Plan.   Exercise goals: As is.  Behavioral modification strategies: meal planning and cooking strategies and planning for success.  Anna Holland has agreed to follow-up with our clinic in 2 weeks. She was informed of the importance of frequent follow-up visits to maximize her success with intensive lifestyle modifications for  her multiple health conditions.   Objective:   Blood pressure 118/66, pulse 72, temperature 98.1 F (36.7 C), temperature source Oral, height 5' (1.524 m), weight 217 lb (98.4 kg), SpO2 98 %. Body mass index is 42.38 kg/m.  General:  Cooperative, alert, well developed, in no acute distress. HEENT: Conjunctivae and lids unremarkable. Cardiovascular: Regular rhythm.  Lungs: Normal work of breathing. Neurologic: No focal deficits.   Lab Results  Component Value Date   CREATININE 0.49 (L) 12/20/2020   BUN 8 12/20/2020   NA 139 12/20/2020   K 4.5 12/20/2020   CL 98 12/20/2020   CO2 26 12/20/2020   Lab Results  Component Value Date   ALT 12 12/20/2020   AST 18 12/20/2020   ALKPHOS 122 (H) 12/20/2020   BILITOT <0.2 12/20/2020   Lab Results  Component Value Date   HGBA1C 5.4 12/20/2020   Lab Results  Component Value Date   INSULIN 23.5 12/20/2020   Lab Results  Component Value Date   TSH 1.650 12/20/2020   Lab Results  Component Value Date   CHOL 181 12/20/2020   HDL 34 (L) 12/20/2020   LDLCALC 125 (H) 12/20/2020   LDLDIRECT 126.0 09/02/2017   TRIG 122 12/20/2020   CHOLHDL 5 06/13/2020   Lab Results  Component Value Date   VD25OH 26.2 (L) 12/20/2020   VD25OH 19.70 (L) 06/13/2020   VD25OH 12.68 (L) 09/02/2017   Lab Results  Component Value Date   WBC 9.5 06/13/2020   HGB 12.5 06/13/2020   HCT 37.3 06/13/2020   MCV 78.7 06/13/2020   PLT 383.0 06/13/2020   No results found for: IRON, TIBC, FERRITIN  Attestation Statements:   Reviewed by clinician on day of visit: allergies, medications, problem list, medical history, surgical history, family history, social history, and previous encounter notes.   Trude Mcburney, am acting as transcriptionist for [employer], PA-C.  I have reviewed the above documentation for accuracy and completeness, and I agree with the above. Alois Cliche, PA-C

## 2021-04-18 LAB — COMPREHENSIVE METABOLIC PANEL
ALT: 11 IU/L (ref 0–32)
AST: 15 IU/L (ref 0–40)
Albumin/Globulin Ratio: 1.2 (ref 1.2–2.2)
Albumin: 4.1 g/dL (ref 3.9–5.0)
Alkaline Phosphatase: 101 IU/L (ref 44–121)
BUN/Creatinine Ratio: 18 (ref 9–23)
BUN: 10 mg/dL (ref 6–20)
Bilirubin Total: 0.3 mg/dL (ref 0.0–1.2)
CO2: 23 mmol/L (ref 20–29)
Calcium: 9.2 mg/dL (ref 8.7–10.2)
Chloride: 102 mmol/L (ref 96–106)
Creatinine, Ser: 0.56 mg/dL — ABNORMAL LOW (ref 0.57–1.00)
Globulin, Total: 3.3 g/dL (ref 1.5–4.5)
Glucose: 85 mg/dL (ref 70–99)
Potassium: 4.3 mmol/L (ref 3.5–5.2)
Sodium: 139 mmol/L (ref 134–144)
Total Protein: 7.4 g/dL (ref 6.0–8.5)
eGFR: 133 mL/min/{1.73_m2} (ref >59)

## 2021-04-18 LAB — LIPID PANEL
Chol/HDL Ratio: 5.3 ratio — ABNORMAL HIGH (ref 0.0–4.4)
Cholesterol, Total: 184 mg/dL (ref 100–199)
HDL: 35 mg/dL — ABNORMAL LOW (ref >39)
LDL Chol Calc (NIH): 133 mg/dL — ABNORMAL HIGH (ref 0–99)
Triglycerides: 88 mg/dL (ref 0–149)
VLDL Cholesterol Cal: 16 mg/dL (ref 5–40)

## 2021-04-18 LAB — INSULIN, RANDOM: INSULIN: 9 u[IU]/mL (ref 2.6–24.9)

## 2021-04-18 LAB — HEMOGLOBIN A1C
Est. average glucose Bld gHb Est-mCnc: 108 mg/dL
Hgb A1c MFr Bld: 5.4 % (ref 4.8–5.6)

## 2021-04-18 LAB — VITAMIN D 25 HYDROXY (VIT D DEFICIENCY, FRACTURES): Vit D, 25-Hydroxy: 40 ng/mL (ref 30.0–100.0)

## 2021-04-30 ENCOUNTER — Other Ambulatory Visit: Payer: Self-pay

## 2021-04-30 ENCOUNTER — Encounter (INDEPENDENT_AMBULATORY_CARE_PROVIDER_SITE_OTHER): Payer: Self-pay | Admitting: Family Medicine

## 2021-04-30 ENCOUNTER — Ambulatory Visit (INDEPENDENT_AMBULATORY_CARE_PROVIDER_SITE_OTHER): Payer: BC Managed Care – PPO | Admitting: Family Medicine

## 2021-04-30 VITALS — BP 103/63 | HR 63 | Temp 98.3°F | Ht 60.0 in | Wt 219.0 lb

## 2021-04-30 DIAGNOSIS — E785 Hyperlipidemia, unspecified: Secondary | ICD-10-CM | POA: Diagnosis not present

## 2021-04-30 DIAGNOSIS — F39 Unspecified mood [affective] disorder: Secondary | ICD-10-CM

## 2021-04-30 DIAGNOSIS — Z6841 Body Mass Index (BMI) 40.0 and over, adult: Secondary | ICD-10-CM

## 2021-04-30 DIAGNOSIS — E786 Lipoprotein deficiency: Secondary | ICD-10-CM

## 2021-04-30 DIAGNOSIS — E559 Vitamin D deficiency, unspecified: Secondary | ICD-10-CM

## 2021-04-30 DIAGNOSIS — G479 Sleep disorder, unspecified: Secondary | ICD-10-CM

## 2021-04-30 DIAGNOSIS — E669 Obesity, unspecified: Secondary | ICD-10-CM

## 2021-04-30 DIAGNOSIS — E8881 Metabolic syndrome: Secondary | ICD-10-CM | POA: Diagnosis not present

## 2021-04-30 DIAGNOSIS — Z9189 Other specified personal risk factors, not elsewhere classified: Secondary | ICD-10-CM

## 2021-04-30 MED ORDER — VITAMIN D (ERGOCALCIFEROL) 1.25 MG (50000 UNIT) PO CAPS: 50000.0000 [IU] | ORAL_CAPSULE | ORAL | 0 refills | Status: DC

## 2021-05-01 ENCOUNTER — Other Ambulatory Visit: Payer: Self-pay | Admitting: Obstetrics & Gynecology

## 2021-05-01 NOTE — Progress Notes (Signed)
Chief Complaint:   OBESITY Anna Holland is here to discuss her progress with her obesity treatment plan along with follow-up of her obesity related diagnoses. Anna Holland is on the Category 2 Plan and states she is following her eating plan approximately 90% of the time. Anna Holland states she is walking the dog for 30 minutes 3-4 times per week.  Today's visit was #: 8 Starting weight: 235 lbs Starting date: 12/20/2020 Today's weight: 219 lbs Today's date: 04/30/2021 Total lbs lost to date: 16 Total lbs lost since last in-office visit: 0  Interim History: Anna Holland is missing a lot of meals, at least once a day and usually lunch. She is doing well otherwise and she is feeling emotionaly better lately. She is here to discuss labs that were done at her last office visit.  Subjective:   1. Insulin resistance Anna Holland has a diagnosis of insulin resistance based on her elevated fasting insulin level >5. Her A1c is within normal limits. And her fasting insulin has decreased from 23.5 to 9.0. She is not on medications. She continues to work on diet and exercise to decrease her risk of diabetes. I discussed labs with the patient today.  2. Sleep difficulties Anna Holland has no consistent sleep cycle. She goes to bed from 11 PM to 2 AM or later, and she wakes up at different times in the morning as well.  3. Vitamin D insufficiency Anna Holland's Vit D level is at goal. She is currently taking prescription vitamin D 50,000 IU each week. She denies nausea, vomiting or muscle weakness. I discussed labs with the patient today.  4. Hyperlipidemia with low HDL Anna Holland has hyperlipidemia and has been trying to improve her cholesterol levels with intensive lifestyle modification including a low saturated fat diet, exercise and weight loss. She denies any chest pain, claudication or myalgias. I discussed labs with the patient today.  5. 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 She likes that her  emotions are more balanced.  6. At risk for depression Anna Holland is at elevated risk of depression due to one or more of the following: family history, significant life stressors, medical conditions and/or poor nutrition.  Assessment/Plan:  No orders of the defined types were placed in this encounter.   Medications Discontinued During This Encounter  Medication Reason   Vitamin D, Ergocalciferol, (DRISDOL) 1.25 MG (50000 UNIT) CAPS capsule Reorder     Meds ordered this encounter  Medications   Vitamin D, Ergocalciferol, (DRISDOL) 1.25 MG (50000 UNIT) CAPS capsule    Sig: Take 1 capsule (50,000 Units total) by mouth every 7 (seven) days.    Dispense:  4 capsule    Refill:  0     1. Insulin resistance Anna Holland will continue her prudent nutritional plan and weight loss, and she is to increase her exercise. Anna Holland agreed to follow-up with [place] as directed to closely monitor her progress.  2. Sleep difficulties Sleep hygiene was discussed with the patient today in detail and the affect it has on her health.  3. Vitamin D insufficiency We will refill prescription Vitamin D 50,000 IU every week for 1 month. Anna Holland will follow-up for routine testing of Vitamin D, at least 2-3 times per year to avoid over-replacement.  - Vitamin D, Ergocalciferol, (DRISDOL) 1.25 MG (50000 UNIT) CAPS capsule; Take 1 capsule (50,000 Units total) by mouth every 7 (seven) days.  Dispense: 4 capsule; Refill: 0  4. Hyperlipidemia with low HDL Cardiovascular risk and specific lipid/LDL goals reviewed.  We discussed several lifestyle modifications today. Anna Holland will continue her prudent nutritional plan with less than 2 days per week of red meat, and she will increase her exercise. Orders and follow up as documented in patient record.   Counseling Intensive lifestyle modifications are the first line treatment for this issue. Dietary changes: Increase soluble fiber. Decrease simple carbohydrates. Exercise changes:  Moderate to vigorous-intensity aerobic activity 150 minutes per week if tolerated. Lipid-lowering medications: see documented in medical record.  5. 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  6. At risk for depression Anna Holland was given approximately 9 minutes of depression prevention counseling today due to their higher than average risk for this condition. The patient has several risk factors for depression such as chronic medical conditions, sleep issues, major life stressors/events, etc., and we discussed these today.  Anna Holland was also counseled on the importance of a healthy work-life balance, a healthy relationship with food, and a good support system.  We discussed various strategies to help cope with these emotions as well.  I recommended counseling, meditation or prayer, healthy eating habits, sleep hygiene, and exercising to help manage these feelings.    7. Obesity with current BMI of 42.9 Anna Holland is currently in the action stage of change. As such, her goal is to continue with weight loss efforts. She has agreed to the Category 2 Plan.   Anna Holland desires to start exercising at the [facility]. She likes to lift weights 2 days per week and do cardio as well on those days.  Exercise goals: For substantial health benefits, adults should do at least 150 minutes (2 hours and 30 minutes) a week of moderate-intensity, or 75 minutes (1 hour and 15 minutes) a week of vigorous-intensity aerobic physical activity, or an equivalent combination of moderate- and vigorous-intensity aerobic activity. Aerobic activity should be performed in episodes of at least 10 minutes, and preferably, it should be spread throughout the week.  Behavioral modification strategies: avoiding temptations and planning for success.  Anna Holland has agreed to follow-up with our clinic in 3 weeks. She was informed of the importance of  frequent follow-up visits to maximize her success with intensive lifestyle modifications for her multiple health conditions.   Objective:   Blood pressure 103/63, pulse 63, temperature 98.3 F (36.8 C), height 5' (1.524 m), weight 219 lb (99.3 kg), SpO2 99 %. Body mass index is 42.77 kg/m.  General: Cooperative, alert, well developed, in no acute distress. HEENT: Conjunctivae and lids unremarkable. Cardiovascular: Regular rhythm.  Lungs: Normal work of breathing. Neurologic: No focal deficits.   Lab Results  Component Value Date   CREATININE 0.56 (L) 04/17/2021   BUN 10 04/17/2021   NA 139 04/17/2021   K 4.3 04/17/2021   CL 102 04/17/2021   CO2 23 04/17/2021   Lab Results  Component Value Date   ALT 11 04/17/2021   AST 15 04/17/2021   ALKPHOS 101 04/17/2021   BILITOT 0.3 04/17/2021   Lab Results  Component Value Date   HGBA1C 5.4 04/17/2021   HGBA1C 5.4 12/20/2020   Lab Results  Component Value Date   INSULIN 9.0 04/17/2021   INSULIN 23.5 12/20/2020   Lab Results  Component Value Date   TSH 1.650 12/20/2020   Lab Results  Component Value Date   CHOL 184 04/17/2021   HDL 35 (L) 04/17/2021   LDLCALC 133 (H) 04/17/2021   LDLDIRECT 126.0 09/02/2017  TRIG 88 04/17/2021   CHOLHDL 5.3 (H) 04/17/2021   Lab Results  Component Value Date   VD25OH 40.0 04/17/2021   VD25OH 26.2 (L) 12/20/2020   VD25OH 19.70 (L) 06/13/2020   Lab Results  Component Value Date   WBC 9.5 06/13/2020   HGB 12.5 06/13/2020   HCT 37.3 06/13/2020   MCV 78.7 06/13/2020   PLT 383.0 06/13/2020   No results found for: IRON, TIBC, FERRITIN  Attestation Statements:   Reviewed by clinician on day of visit: allergies, medications, problem list, medical history, surgical history, family history, social history, and previous encounter notes.   Wilhemena Durie, am acting as transcriptionist for [employer], DO.  I have reviewed the above documentation for accuracy and completeness,  and I agree with the above. Marjory Sneddon, D.O.  The [place] was signed into law in 2016 which includes the topic of electronic health records.  This provides immediate access to information in MyChart.  This includes consultation notes, operative notes, office notes, lab results and pathology reports.  If you have any questions about what you read please let us know at your next visit so we can discuss your concerns and take corrective action if need be.  We are right here with you.

## 2021-05-06 ENCOUNTER — Other Ambulatory Visit: Payer: Self-pay

## 2021-05-06 MED ORDER — PANTOPRAZOLE SODIUM 40 MG PO TBEC: 40.0000 mg | DELAYED_RELEASE_TABLET | Freq: Every day | ORAL | 1 refills | Status: DC

## 2021-05-07 ENCOUNTER — Other Ambulatory Visit: Payer: Self-pay

## 2021-05-07 ENCOUNTER — Ambulatory Visit: Payer: BC Managed Care – PPO | Admitting: Family Medicine

## 2021-05-07 ENCOUNTER — Encounter: Payer: Self-pay | Admitting: Family Medicine

## 2021-05-07 VITALS — BP 122/80 | HR 75 | Temp 98.3°F | Resp 16 | Wt 223.2 lb

## 2021-05-07 DIAGNOSIS — J069 Acute upper respiratory infection, unspecified: Secondary | ICD-10-CM

## 2021-05-07 MED ORDER — PANTOPRAZOLE SODIUM 40 MG PO TBEC: 40.0000 mg | DELAYED_RELEASE_TABLET | Freq: Every day | ORAL | 1 refills | Status: DC

## 2021-05-07 MED ORDER — FLUTICASONE PROPIONATE 50 MCG/ACT NA SUSP: 2.0000 | Freq: Every day | NASAL | 6 refills | Status: DC

## 2021-05-07 MED ORDER — CETIRIZINE HCL 10 MG PO TABS: 10.0000 mg | ORAL_TABLET | Freq: Every day | ORAL | 1 refills | Status: AC

## 2021-05-07 NOTE — Progress Notes (Signed)
° °  Subjective:    Patient ID: Anna Holland, female    DOB: March 14, 2001, 21 y.o.   MRN: [id number]  HPI URI- pt reports she developed a 'scratchy throat' around midday yesterday.  Then developed some congestion.  This AM throat was more painful.  Pt reports that it doesn't hurt to swallow but 'it takes more effort' to swallow'.  No fevers.  No body aches.  Denies HA.  Denies facial pain/pressure.  No known sick contacts.  Negative COVID test.     Review of Systems For ROS see HPI   This visit occurred during the SARS-CoV-2 public health emergency.  Safety protocols were in place, including screening questions prior to the visit, additional usage of staff PPE, and extensive cleaning of exam room while observing appropriate contact time as indicated for disinfecting solutions.      Objective:   Physical Exam Vitals reviewed.  Constitutional:      General: She is not in acute distress.    Appearance: Normal appearance. She is well-developed. She is not ill-appearing.  HENT:     Head: Normocephalic and atraumatic.     Right Ear: Tympanic membrane normal.     Left Ear: Tympanic membrane normal.     Nose: Mucosal edema and rhinorrhea present.     Right Sinus: No maxillary sinus tenderness or frontal sinus tenderness.     Left Sinus: No maxillary sinus tenderness or frontal sinus tenderness.     Mouth/Throat:     Pharynx: Posterior oropharyngeal erythema (w/ PND) present.  Eyes:     Conjunctiva/sclera: Conjunctivae normal.     Pupils: Pupils are equal, round, and reactive to light.  Cardiovascular:     Rate and Rhythm: Normal rate and regular rhythm.     Heart sounds: Normal heart sounds.  Pulmonary:     Effort: Pulmonary effort is normal. No respiratory distress.     Breath sounds: Normal breath sounds. No wheezing or rales.  Musculoskeletal:     Cervical back: Normal range of motion and neck supple.  Lymphadenopathy:     Cervical: No cervical adenopathy.  Skin:    General: Skin is  warm and dry.  Neurological:     General: No focal deficit present.     Mental Status: She is alert and oriented to person, place, and time.  Psychiatric:        Mood and Affect: Mood normal.        Behavior: Behavior normal.        Thought Content: Thought content normal.          Assessment & Plan:   Viral URI- new.  Pt's sxs and PE consistent w/ viral/allergy combo.  No need for abx.  Start Flonase to decrease post nasal drip and congestion.  Add daily antihistamine.  Encouraged fluids, rest.  If sxs change or worsen she is to let me know.  Pt expressed understanding and is in agreement w/ plan.

## 2021-05-07 NOTE — Patient Instructions (Signed)
Follow up as needed or as scheduled START the Flonase- 2 sprays each nostril daily ADD a daily Zyrtec to help w/ congestion and drainage Drink LOTS of fluids REST! Tylenol or ibuprofen as needed for sore throat Call with any questions or concerns Hang in there!!

## 2021-05-09 ENCOUNTER — Ambulatory Visit: Payer: Self-pay | Admitting: Family Medicine

## 2021-05-22 ENCOUNTER — Encounter (INDEPENDENT_AMBULATORY_CARE_PROVIDER_SITE_OTHER): Payer: Self-pay | Admitting: Physician Assistant

## 2021-05-22 ENCOUNTER — Ambulatory Visit (INDEPENDENT_AMBULATORY_CARE_PROVIDER_SITE_OTHER): Payer: BC Managed Care – PPO | Admitting: Physician Assistant

## 2021-05-22 ENCOUNTER — Other Ambulatory Visit: Payer: Self-pay

## 2021-05-22 VITALS — BP 95/62 | HR 74 | Temp 98.2°F | Ht 60.0 in | Wt 213.0 lb

## 2021-05-22 DIAGNOSIS — E559 Vitamin D deficiency, unspecified: Secondary | ICD-10-CM | POA: Diagnosis not present

## 2021-05-22 DIAGNOSIS — E669 Obesity, unspecified: Secondary | ICD-10-CM

## 2021-05-22 DIAGNOSIS — Z6841 Body Mass Index (BMI) 40.0 and over, adult: Secondary | ICD-10-CM | POA: Diagnosis not present

## 2021-05-22 NOTE — Progress Notes (Signed)
Chief Complaint:   OBESITY Anna Holland is here to discuss her progress with her obesity treatment plan along with follow-up of her obesity related diagnoses. Anna Holland is on the Category 2 Plan and states she is following her eating plan approximately 85-90% of the time. Anna Holland states she is walking the dog and walking to class 30 minutes 3-4 times per week.  Today's visit was #: 9 Starting weight: 235 lbs Starting date: 12/20/2020 Today's weight: 213 lbs Today's date: 05/22/2021 Total lbs lost to date: 22 Total lbs lost since last in-office visit: 6  Interim History: Pt has not been skipping any meals. She is also trying to get more sleep, averaging 6 hours. She is traveling to [place] for a week at the beginning of March.  Subjective:   1. Vitamin D insufficiency Anna Holland denies nausea, vomiting, and muscle weakness and is on prescription Vit D and taking as prescribed.  Assessment/Plan:   1. Vitamin D insufficiency Low Vitamin D level contributes to fatigue and are associated with obesity, breast, and colon cancer. She agrees to continue to take prescription Vitamin D 50,000 IU every week and will follow-up for routine testing of Vitamin D, at least 2-3 times per year to avoid over-replacement.  2. Obesity with current BMI of 41.6 Anna Holland is currently in the action stage of change. As such, her goal is to continue with weight loss efforts. She has agreed to the Category 2 Plan.   Exercise goals:  As is  Behavioral modification strategies: travel eating strategies and celebration eating strategies.  Anna Holland has agreed to follow-up with our clinic in 2 weeks. She was informed of the importance of frequent follow-up visits to maximize her success with intensive lifestyle modifications for her multiple health conditions.   Objective:   Blood pressure 95/62, pulse 74, temperature 98.2 F (36.8 C), height 5' (1.524 m), weight 213 lb (96.6 kg), SpO2 97 %. Body mass index is 41.6  kg/m.  General: Cooperative, alert, well developed, in no acute distress. HEENT: Conjunctivae and lids unremarkable. Cardiovascular: Regular rhythm.  Lungs: Normal work of breathing. Neurologic: No focal deficits.   Lab Results  Component Value Date   CREATININE 0.56 (L) 04/17/2021   BUN 10 04/17/2021   NA 139 04/17/2021   K 4.3 04/17/2021   CL 102 04/17/2021   CO2 23 04/17/2021   Lab Results  Component Value Date   ALT 11 04/17/2021   AST 15 04/17/2021   ALKPHOS 101 04/17/2021   BILITOT 0.3 04/17/2021   Lab Results  Component Value Date   HGBA1C 5.4 04/17/2021   HGBA1C 5.4 12/20/2020   Lab Results  Component Value Date   INSULIN 9.0 04/17/2021   INSULIN 23.5 12/20/2020   Lab Results  Component Value Date   TSH 1.650 12/20/2020   Lab Results  Component Value Date   CHOL 184 04/17/2021   HDL 35 (L) 04/17/2021   LDLCALC 133 (H) 04/17/2021   LDLDIRECT 126.0 09/02/2017   TRIG 88 04/17/2021   CHOLHDL 5.3 (H) 04/17/2021   Lab Results  Component Value Date   VD25OH 40.0 04/17/2021   VD25OH 26.2 (L) 12/20/2020   VD25OH 19.70 (L) 06/13/2020   Lab Results  Component Value Date   WBC 9.5 06/13/2020   HGB 12.5 06/13/2020   HCT 37.3 06/13/2020   MCV 78.7 06/13/2020   PLT 383.0 06/13/2020   Attestation Statements:   Reviewed by clinician on day of visit: allergies, medications, problem list, medical history, surgical history,  family history, social history, and previous encounter notes.  Time spent on visit including pre-visit chart review and post-visit care and charting was 30 minutes.   Edmund Hilda, CMA, am acting as transcriptionist for [employer], PA-C.  I have reviewed the above documentation for accuracy and completeness, and I agree with the above. Alois Cliche, PA-C

## 2021-06-04 ENCOUNTER — Ambulatory Visit (INDEPENDENT_AMBULATORY_CARE_PROVIDER_SITE_OTHER): Payer: BC Managed Care – PPO | Admitting: Physician Assistant

## 2021-06-18 ENCOUNTER — Encounter: Payer: Self-pay | Admitting: Family Medicine

## 2021-06-27 ENCOUNTER — Encounter (INDEPENDENT_AMBULATORY_CARE_PROVIDER_SITE_OTHER): Payer: Self-pay | Admitting: Family Medicine

## 2021-06-27 ENCOUNTER — Ambulatory Visit (INDEPENDENT_AMBULATORY_CARE_PROVIDER_SITE_OTHER): Payer: BC Managed Care – PPO | Admitting: Family Medicine

## 2021-06-27 ENCOUNTER — Other Ambulatory Visit (INDEPENDENT_AMBULATORY_CARE_PROVIDER_SITE_OTHER): Payer: Self-pay | Admitting: Family Medicine

## 2021-06-27 ENCOUNTER — Other Ambulatory Visit: Payer: Self-pay

## 2021-06-27 VITALS — BP 107/69 | HR 82 | Temp 98.2°F | Ht 60.0 in | Wt 213.0 lb

## 2021-06-27 DIAGNOSIS — E559 Vitamin D deficiency, unspecified: Secondary | ICD-10-CM | POA: Diagnosis not present

## 2021-06-27 DIAGNOSIS — G479 Sleep disorder, unspecified: Secondary | ICD-10-CM | POA: Diagnosis not present

## 2021-06-27 DIAGNOSIS — K5909 Other constipation: Secondary | ICD-10-CM | POA: Diagnosis not present

## 2021-06-27 DIAGNOSIS — E669 Obesity, unspecified: Secondary | ICD-10-CM

## 2021-06-27 DIAGNOSIS — Z6841 Body Mass Index (BMI) 40.0 and over, adult: Secondary | ICD-10-CM

## 2021-06-27 DIAGNOSIS — Z9189 Other specified personal risk factors, not elsewhere classified: Secondary | ICD-10-CM

## 2021-06-27 MED ORDER — VITAMIN D (ERGOCALCIFEROL) 1.25 MG (50000 UNIT) PO CAPS: 50000.0000 [IU] | ORAL_CAPSULE | ORAL | 0 refills | Status: DC

## 2021-07-02 NOTE — Progress Notes (Signed)
? ? ? ?Chief Complaint:  ? ?OBESITY ?Anna Holland is here to discuss her progress with her obesity treatment plan along with follow-up of her obesity related diagnoses. Anna Holland is on the Category 2 Plan and states she is following her eating plan approximately 80% of the time. Anna Holland states she is walking the dog, and walking to classes for 30 minutes 3 times per week. ? ?Today's visit was #: 10 ?Starting weight: 235 lbs ?Starting date: 12/20/2020 ?Today's weight: 213 lbs ?Today's date: 06/27/2021 ?Total lbs lost to date: 22 ?Total lbs lost since last in-office visit: 0 ? ?Interim History: Anna Holland was on vacation and her goal was to maintain and she did. Snacks=greek yogurt, apple, and granola cereal. No issues with the meal plan. ? ?Subjective:  ? ?1. Sleep difficulties ?Anna Holland denies issues currently. She sleeps 7 hours per night on average. More rested as of lately. At times she can sleep 9-11 hours. ? ?2. Vitamin D insufficiency ?Anna Holland is tolerating medication(s) well without side effects.  Medication compliance is good and patient appears to be taking it as prescribed.  The patient denies additional concerns regarding this condition.  ? ?3. Other constipation ?Anna Holland symptoms have improved now. No concerns. She is walking more around campus, and trying to drink more water. ? ?4. At risk for dehydration ?Anna Holland is at risk for dehydration due to inadequate water intake. ? ?Assessment/Plan:  ?No orders of the defined types were placed in this encounter. ? ? ?Medications Discontinued During This Encounter  ?Medication Reason  ? Vitamin D, Ergocalciferol, (DRISDOL) 1.25 MG (50000 UNIT) CAPS capsule Reorder  ?  ? ?Meds ordered this encounter  ?Medications  ? Vitamin D, Ergocalciferol, (DRISDOL) 1.25 MG (50000 UNIT) CAPS capsule  ?  Sig: Take 1 capsule (50,000 Units total) by mouth every 7 (seven) days.  ?  Dispense:  4 capsule  ?  Refill:  0  ?  ? ?1. Sleep difficulties ?Sleep hygiene was discussed with the patient. She is  to try to get between 7-9 hours of sleep per night. ? ?2. Vitamin D insufficiency ?- I again reiterated the importance of vitamin D (as well as calcium) to their health and wellbeing.  ?- I reviewed possible symptoms of low Vitamin D:  low energy, depressed mood, muscle aches, joint aches, osteoporosis etc. ?- low Vitamin D levels may be linked to an increased risk of cardiovascular events and even increased risk of cancers- such as colon and breast.  ?- ideal vitamin D levels reviewed with patient  ?- I recommend pt take a weekly prescription vit D - see script below   ?- Informed patient this may be a lifelong thing, and she was encouraged to continue to take the medicine until told otherwise.    ?- weight loss will likely improve availability of vitamin D, thus encouraged Anna Holland to continue with meal plan and their weight loss efforts to further improve this condition.  Thus, we will need to monitor levels regularly (every 3-4 mo on average) to keep levels within normal limits and prevent over supplementation. ?- pt's questions and concerns regarding this condition addressed. ? ?- Vitamin D, Ergocalciferol, (DRISDOL) 1.25 MG (50000 UNIT) CAPS capsule; Take 1 capsule (50,000 Units total) by mouth every 7 (seven) days.  Dispense: 4 capsule; Refill: 0 ? ?3. Other constipation ?Anna Holland will continue miralax as needed, increase exercise, and increase her water intake.  ? ?4. At risk for dehydration ?Anna Holland was given approximately 10 minutes of dehydration prevention counseling today. Anna Holland  is at risk for dehydration due to weight loss and current medication(s). She was encouraged to hydrate and monitor fluid status to avoid dehydration as weight loss plateaus. ? ?5. Obesity with current BMI of 41.6 ?Anna Holland is currently in the action stage of change. As such, her goal is to continue with weight loss efforts. She has agreed to the Category 2 Plan.  ? ?Start walking or meditation, yoga, or prayer. ? ?Exercise goals: For  substantial health benefits, adults should do at least 150 minutes (2 hours and 30 minutes) a week of moderate-intensity, or 75 minutes (1 hour and 15 minutes) a week of vigorous-intensity aerobic physical activity, or an equivalent combination of moderate- and vigorous-intensity aerobic activity. Aerobic activity should be performed in episodes of at least 10 minutes, and preferably, it should be spread throughout the week. ? ?Behavioral modification strategies: increasing lean protein intake, decreasing simple carbohydrates, and planning for success. ? ?Anna Holland has agreed to follow-up with our clinic in 3 weeks. She was informed of the importance of frequent follow-up visits to maximize her success with intensive lifestyle modifications for her multiple health conditions.  ? ?Objective:  ? ?Blood pressure 107/69, pulse 82, temperature 98.2 ?F (36.8 ?C), height 5' (1.524 m), weight 213 lb (96.6 kg), SpO2 97 %. ?Body mass index is 41.6 kg/m?. ? ?General: Cooperative, alert, well developed, in no acute distress. ?HEENT: Conjunctivae and lids unremarkable. ?Cardiovascular: Regular rhythm.  ?Lungs: Normal work of breathing. ?Neurologic: No focal deficits.  ? ?Lab Results  ?Component Value Date  ? CREATININE 0.56 (L) 04/17/2021  ? BUN 10 04/17/2021  ? NA 139 04/17/2021  ? K 4.3 04/17/2021  ? CL 102 04/17/2021  ? CO2 23 04/17/2021  ? ?Lab Results  ?Component Value Date  ? ALT 11 04/17/2021  ? AST 15 04/17/2021  ? ALKPHOS 101 04/17/2021  ? BILITOT 0.3 04/17/2021  ? ?Lab Results  ?Component Value Date  ? HGBA1C 5.4 04/17/2021  ? HGBA1C 5.4 12/20/2020  ? ?Lab Results  ?Component Value Date  ? INSULIN 9.0 04/17/2021  ? INSULIN 23.5 12/20/2020  ? ?Lab Results  ?Component Value Date  ? TSH 1.650 12/20/2020  ? ?Lab Results  ?Component Value Date  ? CHOL 184 04/17/2021  ? HDL 35 (L) 04/17/2021  ? LDLCALC 133 (H) 04/17/2021  ? LDLDIRECT 126.0 09/02/2017  ? TRIG 88 04/17/2021  ? CHOLHDL 5.3 (H) 04/17/2021  ? ?Lab Results   ?Component Value Date  ? VD25OH 40.0 04/17/2021  ? VD25OH 26.2 (L) 12/20/2020  ? VD25OH 19.70 (L) 06/13/2020  ? ?Lab Results  ?Component Value Date  ? WBC 9.5 06/13/2020  ? HGB 12.5 06/13/2020  ? HCT 37.3 06/13/2020  ? MCV 78.7 06/13/2020  ? PLT 383.0 06/13/2020  ? ?No results found for: IRON, TIBC, FERRITIN ? ?Attestation Statements:  ? ?Reviewed by clinician on day of visit: allergies, medications, problem list, medical history, surgical history, family history, social history, and previous encounter notes. ? ? ?I, Burt Knack, am acting as transcriptionist for [employer], DO. ? ?I have reviewed the above documentation for accuracy and completeness, and I agree with the above. Carlye Grippe, D.O. ? ?The 21st Century Cures Act was signed into law in 2016 which includes the topic of electronic health records.  This provides immediate access to information in MyChart.  This includes consultation notes, operative notes, office notes, lab results and pathology reports.  If you have any questions about what you read please let us know  at your next visit so we can discuss your concerns and take corrective action if need be.  We are right here with you. ? ? ?

## 2021-07-17 ENCOUNTER — Ambulatory Visit (INDEPENDENT_AMBULATORY_CARE_PROVIDER_SITE_OTHER): Payer: BC Managed Care – PPO | Admitting: Nurse Practitioner

## 2021-07-17 ENCOUNTER — Encounter (INDEPENDENT_AMBULATORY_CARE_PROVIDER_SITE_OTHER): Payer: Self-pay | Admitting: Nurse Practitioner

## 2021-07-17 VITALS — BP 97/66 | HR 62 | Temp 98.1°F | Ht 60.0 in | Wt 215.0 lb

## 2021-07-17 DIAGNOSIS — E559 Vitamin D deficiency, unspecified: Secondary | ICD-10-CM

## 2021-07-17 DIAGNOSIS — E669 Obesity, unspecified: Secondary | ICD-10-CM | POA: Diagnosis not present

## 2021-07-17 DIAGNOSIS — E785 Hyperlipidemia, unspecified: Secondary | ICD-10-CM

## 2021-07-17 DIAGNOSIS — Z9189 Other specified personal risk factors, not elsewhere classified: Secondary | ICD-10-CM

## 2021-07-17 DIAGNOSIS — Z6841 Body Mass Index (BMI) 40.0 and over, adult: Secondary | ICD-10-CM | POA: Diagnosis not present

## 2021-07-17 MED ORDER — WEGOVY 0.25 MG/0.5ML ~~LOC~~ SOAJ: 0.2500 mg | SUBCUTANEOUS | 0 refills | Status: DC

## 2021-07-25 NOTE — Progress Notes (Signed)
? ? ? ?Chief Complaint:  ? ?OBESITY ?Anna Holland is here to discuss her progress with her obesity treatment plan along with follow-up of her obesity related diagnoses. Anna Holland is on the Category 2 Plan and states she is following her eating plan approximately 90% of the time. Anna Holland states she is walking the dog for 30 minutes 3 times per week. ? ?Today's visit was #: 11 ?Starting weight: 235 lbs ?Starting date: 12/20/2020 ?Today's weight: 215 lbs ?Today's date: 07/17/2021 ?Total lbs lost to date: 20 lbs ?Total lbs lost since last in-office visit: 0 ? 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 She notes some stress eating and cravings. She continues to struggle with hunger but notes the hunger comes in waves. Her mother is a patient here. She is working on increasing her water intake daily.   ? ?Subjective:  ? ?1. Vitamin D insufficiency ?Anna Holland's last Vitamin D looked better at 40. She is currently taking Vitamin D 50,000 IU weekly. She denies side effects nausea, vomiting, or muscle weakness.  ? ?2. Hyperlipidemia, unspecified hyperlipidemia type ?Anna Holland has never been on statin.  ? ?3. At risk for side effect of medication ?Anna Holland is at risk for side effect of medication due to start of Wegovy.  ? ?Assessment/Plan:  ? ?1. Vitamin D insufficiency ?Low Vitamin D level contributes to fatigue and are associated with obesity, breast, and colon cancer. Anna Holland agrees to continue to take prescription Vitamin D 50,000 IU every week as directed and she will follow-up for routine testing of Vitamin D, at least 2-3 times per year to avoid over-replacement. We discussed side effects.  ? ?2. Hyperlipidemia, unspecified hyperlipidemia type ?Cardiovascular risk and specific lipid/LDL goals reviewed.  We discussed several lifestyle modifications today and Anna Holland will continue to work on diet, exercise and weight loss efforts. Orders and follow up as documented in patient record.   ? ?Counseling ?Intensive lifestyle modifications are the first line treatment for this issue. ?Dietary changes: Increase soluble fiber. Decrease simple carbohydrates. ?Exercise changes: Moderate to vigorous-intensity aerobic activity 150 minutes per week if tolerated. ?Lipid-lowering medications: see documented in medical record. ? ?3. At risk for side effect of medication ?Anna Holland was given approximately 15 minutes of drug side effect counseling today.  We discussed side effect possibility and risk versus benefits. Anna Holland agreed to the medication and will contact this office if these side effects are intolerable. ? ?Repetitive spaced learning was employed today to elicit superior memory formation and behavioral change.  ? ?4. Obesity with current BMI of 42.0 ?Anna Holland is currently in the action stage of change. As such, her goal is to continue with weight loss efforts. She has agreed to the Category 2 Plan.  ? ?Anna Holland agrees to start Wegovy 0.25 mg. We discussed Denies history of pancreatitis, gallstones, medullary thyroid cancers or MENS 2.  ? ?- Semaglutide-Weight Management (WEGOVY) 0.25 MG/0.5ML SOAJ; Inject 0.25 mg into the skin once a week.  Dispense: 2 mL; Refill: 0 ? ?Exercise goals:  As is. ? ?Behavioral modification strategies: increasing lean protein intake, increasing water intake, and no skipping meals. ? ?Anna Holland has agreed to follow-up with our clinic in 2 weeks. She was informed of the importance of frequent follow-up visits to maximize her success with intensive lifestyle modifications for her multiple health conditions.  ? ?Objective:  ? ?Blood pressure 97/66, pulse 62, temperature 98.1 ?F (36.7 ?C), height 5' (1.524 m), weight 215 lb (97.5 kg), SpO2 98 %. ?Body mass index  is 41.99 kg/m?. ? ?General: Cooperative, alert, well developed, in no acute distress. ?HEENT: Conjunctivae and lids unremarkable. ?Cardiovascular: Regular rhythm.  ?Lungs: Normal work of breathing. ?Neurologic: No focal deficits.   ? ?Lab Results  ?Component Value Date  ? CREATININE 0.56 (L) 04/17/2021  ? BUN 10 04/17/2021  ? NA 139 04/17/2021  ? K 4.3 04/17/2021  ? CL 102 04/17/2021  ? CO2 23 04/17/2021  ? ?Lab Results  ?Component Value Date  ? ALT 11 04/17/2021  ? AST 15 04/17/2021  ? ALKPHOS 101 04/17/2021  ? BILITOT 0.3 04/17/2021  ? ?Lab Results  ?Component Value Date  ? HGBA1C 5.4 04/17/2021  ? HGBA1C 5.4 12/20/2020  ? ?Lab Results  ?Component Value Date  ? INSULIN 9.0 04/17/2021  ? INSULIN 23.5 12/20/2020  ? ?Lab Results  ?Component Value Date  ? TSH 1.650 12/20/2020  ? ?Lab Results  ?Component Value Date  ? CHOL 184 04/17/2021  ? HDL 35 (L) 04/17/2021  ? LDLCALC 133 (H) 04/17/2021  ? LDLDIRECT 126.0 09/02/2017  ? TRIG 88 04/17/2021  ? CHOLHDL 5.3 (H) 04/17/2021  ? ?Lab Results  ?Component Value Date  ? VD25OH 40.0 04/17/2021  ? VD25OH 26.2 (L) 12/20/2020  ? VD25OH 19.70 (L) 06/13/2020  ? ?Lab Results  ?Component Value Date  ? WBC 9.5 06/13/2020  ? HGB 12.5 06/13/2020  ? HCT 37.3 06/13/2020  ? MCV 78.7 06/13/2020  ? PLT 383.0 06/13/2020  ? ?No results found for: IRON, TIBC, FERRITIN ? ?Attestation Statements:  ? ?Reviewed by clinician on day of visit: allergies, medications, problem list, medical history, surgical history, family history, social history, and previous encounter notes. ? ?I, Jackson Latino, RMA, am acting as Energy manager for Irene Limbo, FNP. ? ?I have reviewed the above documentation for accuracy and completeness, and I agree with the above. Irene Limbo, FNP  ?

## 2021-07-29 ENCOUNTER — Telehealth: Payer: BC Managed Care – PPO | Admitting: Physician Assistant

## 2021-07-29 ENCOUNTER — Telehealth: Payer: BC Managed Care – PPO

## 2021-07-29 DIAGNOSIS — S46812A Strain of other muscles, fascia and tendons at shoulder and upper arm level, left arm, initial encounter: Secondary | ICD-10-CM | POA: Diagnosis not present

## 2021-07-29 MED ORDER — METHOCARBAMOL 500 MG PO TABS: 500.0000 mg | ORAL_TABLET | Freq: Three times a day (TID) | ORAL | 0 refills | Status: DC

## 2021-07-29 NOTE — Patient Instructions (Signed)
?  Revonda Humphrey, thank you for joining Piedad Climes, PA-C for today's virtual visit.  While this provider is not your primary care provider (PCP), if your PCP is located in our provider database this encounter information will be shared with them immediately following your visit. ? ?Consent: ?(Patient) Anna Holland provided verbal consent for this virtual visit at the beginning of the encounter. ? ?Current Medications: ? ?Current Outpatient Medications:  ?  buPROPion (WELLBUTRIN XL) 150 MG 24 hr tablet, Take 1 tablet (150 mg total) by mouth daily., Disp: 30 tablet, Rfl: 3 ?  cetirizine (ZYRTEC) 10 MG tablet, Take 1 tablet (10 mg total) by mouth daily., Disp: 90 tablet, Rfl: 1 ?  citalopram (CELEXA) 20 MG tablet, Take 1 tablet (20 mg total) by mouth daily., Disp: 30 tablet, Rfl: 3 ?  fluticasone (FLONASE) 50 MCG/ACT nasal spray, Place 2 sprays into both nostrils daily., Disp: 16 g, Rfl: 6 ?  levonorgestrel (KYLEENA) 19.5 MG IUD, 1 each by Intrauterine route once., Disp: , Rfl:  ?  meloxicam (MOBIC) 15 MG tablet, TAKE 1 TABLET BY MOUTH EVERY DAY AS NEEDED FOR PAIN, Disp: 30 tablet, Rfl: 0 ?  pantoprazole (PROTONIX) 40 MG tablet, Take 1 tablet (40 mg total) by mouth daily., Disp: 90 tablet, Rfl: 1 ?  polyethylene glycol (MIRALAX / GLYCOLAX) 17 g packet, Take 17 g by mouth 2 (two) times daily. Until stooling regularly, Disp: 60 packet, Rfl: 0 ?  Semaglutide-Weight Management (WEGOVY) 0.25 MG/0.5ML SOAJ, Inject 0.25 mg into the skin once a week., Disp: 2 mL, Rfl: 0 ?  Vitamin D, Ergocalciferol, (DRISDOL) 1.25 MG (50000 UNIT) CAPS capsule, Take 1 capsule (50,000 Units total) by mouth every 7 (seven) days., Disp: 4 capsule, Rfl: 0  ? ?Medications ordered in this encounter:  ?No orders of the defined types were placed in this encounter. ?  ? ?*If you need refills on other medications prior to your next appointment, please contact your pharmacy* ? ?Follow-Up: ?Call back or seek an in-person evaluation if the symptoms  worsen or if the condition fails to improve as anticipated. ? ?Other Instructions ?Please avoid heavy lifting or overexertion. ?Stop the Aleve. ?Start your Meloxicam once daily for the next few days. ?Tylenol ES over-the-counter for breakthrough pain. ?You can take the Robaxin in the evening. You can take up to three times daily if needed but can make you drowsy so not driving or operating heavy machinery while on medication in daytime.  ?Ok to continue heating pad. ? ?If not resolving, please follow-up with Dr. Beverely Low or one of her colleagues.  ? ?Good luck with the rest of your school semester!!!! ? ? ?If you have been instructed to have an in-person evaluation today at a local Urgent Care facility, please use the link below. It will take you to a list of all of our available [facility], including address, phone number and hours of operation. Please do not delay care.  ?[facility] ? ?If you or a family member do not have a primary care provider, use the link below to schedule a visit and establish care. When you choose a [facility] primary care physician or advanced practice provider, you gain a long-term partner in health. ?Find a Primary Care Provider ? ?Learn more about [facility] in-office and virtual care options: ?[facility] - Get Care Now  ?

## 2021-07-29 NOTE — Progress Notes (Signed)
?Virtual Visit Consent  ? ?Anna Holland, you are scheduled for a virtual visit with a [facility] provider today.   ?  ?Just as with appointments in the office, your consent must be obtained to participate.  Your consent will be active for this visit and any virtual visit you may have with one of our providers in the next 365 days.   ?  ?If you have a MyChart account, a copy of this consent can be sent to you electronically.  All virtual visits are billed to your insurance company just like a traditional visit in the office.   ? ?As this is a virtual visit, video technology does not allow for your provider to perform a traditional examination.  This may limit your provider's ability to fully assess your condition.  If your provider identifies any concerns that need to be evaluated in person or the need to arrange testing (such as labs, EKG, etc.), we will make arrangements to do so.   ?  ?Although advances in technology are sophisticated, we cannot ensure that it will always work on either your end or our end.  If the connection with a video visit is poor, the visit may have to be switched to a telephone visit.  With either a video or telephone visit, we are not always able to ensure that we have a secure connection.    ? ?Also, by engaging in this virtual visit, you consent to the provision of healthcare. Additionally, you authorize for your insurance to be billed (if applicable) for the services provided during this visit.  ? ?I need to obtain your verbal consent now.   Are you willing to proceed with your visit today?  ?  ?Anna Holland has provided verbal consent on 07/29/2021 for a virtual visit (video or telephone). ?  ?Piedad Climes, PA-C  ? ?Date: 07/29/2021 7:41 PM ? ? ?Virtual Visit via Video Note  ? ?IPiedad Climes, connected with  Anna Holland  ([id number], 2000/04/20) on 07/29/21 at  7:45 PM EDT by a video-enabled telemedicine application and verified that I am speaking with the correct  person using two identifiers. ? ?Location: ?Patient: Virtual Visit Location Patient: Home ?Provider: Virtual Visit Location Provider: Home Office ?  ?I discussed the limitations of evaluation and management by telemedicine and the availability of in person appointments. The patient expressed understanding and agreed to proceed.   ? ?History of Present Illness: ?Anna Holland is a 21 y.o. who identifies as a female who was assigned female at birth, and is being seen today for about 4-5 days of Left trapezius discomfort, first noted when laying down and turning. Now is more consistent, despite what position she is in. Does note radiate into arm or down the shoulder blade. Denies numbness, tingling or weakness of upper extremity. Denies decreased ROM of shoulder. Has been taking Aleve x 3 days. Bengay topically -- only mild, temporary relief. Denies change in activity level. Some stress with school.  ? ?HPI: HPI  ?Problems:  ?Patient Active Problem List  ? Diagnosis Date Noted  ? Vitamin D insufficiency 12/26/2020  ? Low HDL (under 40) 12/26/2020  ? Acne vulgaris 12/20/2020  ? Atopic dermatitis 12/20/2020  ? GERD (gastroesophageal reflux disease) 06/13/2020  ? Morbid obesity (HCC) 06/13/2020  ? Pain in right foot 06/16/2019  ? Bilateral impacted cerumen 12/06/2018  ? Physical exam 09/02/2017  ?  ?Allergies: No Known Allergies ?Medications:  ?Current Outpatient Medications:  ?  buPROPion (WELLBUTRIN XL)  150 MG 24 hr tablet, Take 1 tablet (150 mg total) by mouth daily., Disp: 30 tablet, Rfl: 3 ?  cetirizine (ZYRTEC) 10 MG tablet, Take 1 tablet (10 mg total) by mouth daily., Disp: 90 tablet, Rfl: 1 ?  citalopram (CELEXA) 20 MG tablet, Take 1 tablet (20 mg total) by mouth daily., Disp: 30 tablet, Rfl: 3 ?  fluticasone (FLONASE) 50 MCG/ACT nasal spray, Place 2 sprays into both nostrils daily., Disp: 16 g, Rfl: 6 ?  levonorgestrel (KYLEENA) 19.5 MG IUD, 1 each by Intrauterine route once., Disp: , Rfl:  ?  meloxicam (MOBIC)  15 MG tablet, TAKE 1 TABLET BY MOUTH EVERY DAY AS NEEDED FOR PAIN, Disp: 30 tablet, Rfl: 0 ?  methocarbamol (ROBAXIN) 500 MG tablet, Take 1 tablet (500 mg total) by mouth 3 (three) times daily., Disp: 15 tablet, Rfl: 0 ?  pantoprazole (PROTONIX) 40 MG tablet, Take 1 tablet (40 mg total) by mouth daily., Disp: 90 tablet, Rfl: 1 ?  polyethylene glycol (MIRALAX / GLYCOLAX) 17 g packet, Take 17 g by mouth 2 (two) times daily. Until stooling regularly, Disp: 60 packet, Rfl: 0 ?  Semaglutide-Weight Management (WEGOVY) 0.25 MG/0.5ML SOAJ, Inject 0.25 mg into the skin once a week., Disp: 2 mL, Rfl: 0 ?  Vitamin D, Ergocalciferol, (DRISDOL) 1.25 MG (50000 UNIT) CAPS capsule, Take 1 capsule (50,000 Units total) by mouth every 7 (seven) days., Disp: 4 capsule, Rfl: 0 ? ?Observations/Objective: ?Patient is well-developed, well-nourished in no acute distress.  ?Resting comfortably at home.  ?Head is normocephalic, atraumatic.  ?No labored breathing. ?Speech is clear and coherent with logical content.  ?Patient is alert and oriented at baseline.  ?Normal ROM of L shoulder demonstrated by patient.  ?Normal ROM of neck albeit with some tension and discomfort with rotation.  ? ?Assessment and Plan: ?1. Trapezius muscle strain, left, initial encounter ?- methocarbamol (ROBAXIN) 500 MG tablet; Take 1 tablet (500 mg total) by mouth 3 (three) times daily.  Dispense: 15 tablet; Refill: 0 ? ?Atraumatic. Will have her stop Aleve OTC. Restart her Meloxicam once daily. Tylenol ES OTC for breakthrough pain. Robaxin to take at night. Can take up to TID when she is going to be at home all day. Continue heating pad. Follow-up with PCP in-person if not resolving or for any new/worsening symptom.  ? ?Follow Up Instructions: ?I discussed the assessment and treatment plan with the patient. The patient was provided an opportunity to ask questions and all were answered. The patient agreed with the plan and demonstrated an understanding of the  instructions.  A copy of instructions were sent to the patient via MyChart unless otherwise noted below.  ? ?The patient was advised to call back or seek an in-person evaluation if the symptoms worsen or if the condition fails to improve as anticipated. ? ?Time:  ?I spent 12 minutes with the patient via telehealth technology discussing the above problems/concerns.   ? ?Piedad Climes, PA-C ?

## 2021-07-31 ENCOUNTER — Ambulatory Visit: Payer: BC Managed Care – PPO | Admitting: Family Medicine

## 2021-08-01 ENCOUNTER — Ambulatory Visit (INDEPENDENT_AMBULATORY_CARE_PROVIDER_SITE_OTHER): Payer: BC Managed Care – PPO | Admitting: Family Medicine

## 2021-08-01 ENCOUNTER — Encounter (INDEPENDENT_AMBULATORY_CARE_PROVIDER_SITE_OTHER): Payer: Self-pay | Admitting: Family Medicine

## 2021-08-01 ENCOUNTER — Telehealth (INDEPENDENT_AMBULATORY_CARE_PROVIDER_SITE_OTHER): Payer: Self-pay | Admitting: Family Medicine

## 2021-08-01 VITALS — BP 122/73 | HR 71 | Temp 98.5°F | Ht 60.0 in | Wt 216.0 lb

## 2021-08-01 DIAGNOSIS — E559 Vitamin D deficiency, unspecified: Secondary | ICD-10-CM

## 2021-08-01 DIAGNOSIS — E669 Obesity, unspecified: Secondary | ICD-10-CM | POA: Diagnosis not present

## 2021-08-01 DIAGNOSIS — R632 Polyphagia: Secondary | ICD-10-CM

## 2021-08-01 DIAGNOSIS — E8881 Metabolic syndrome: Secondary | ICD-10-CM | POA: Diagnosis not present

## 2021-08-01 DIAGNOSIS — Z9189 Other specified personal risk factors, not elsewhere classified: Secondary | ICD-10-CM

## 2021-08-01 DIAGNOSIS — Z6841 Body Mass Index (BMI) 40.0 and over, adult: Secondary | ICD-10-CM

## 2021-08-01 NOTE — Telephone Encounter (Signed)
PA needed

## 2021-08-01 NOTE — Telephone Encounter (Signed)
Prior authorization started for Wegovy. Will notify patient and provider once response is received.  

## 2021-08-01 NOTE — Telephone Encounter (Signed)
Patient states that she needs a prior authorization for [facility]. Please call pt at the number on file. ?

## 2021-08-05 ENCOUNTER — Encounter (INDEPENDENT_AMBULATORY_CARE_PROVIDER_SITE_OTHER): Payer: Self-pay

## 2021-08-05 ENCOUNTER — Telehealth (INDEPENDENT_AMBULATORY_CARE_PROVIDER_SITE_OTHER): Payer: Self-pay | Admitting: Nurse Practitioner

## 2021-08-05 NOTE — Telephone Encounter (Signed)
Patient sent approval message via mychart.  ?

## 2021-08-05 NOTE — Telephone Encounter (Signed)
Prior authorization approved for Wegovy. Effective: 08/01/2021 - 03/03/2022. Patient sent approval message via mychart.  ?

## 2021-08-06 ENCOUNTER — Encounter (INDEPENDENT_AMBULATORY_CARE_PROVIDER_SITE_OTHER): Payer: Self-pay | Admitting: Family Medicine

## 2021-08-19 NOTE — Progress Notes (Signed)
? ? ? ?Chief Complaint:  ? ?OBESITY ?Anna Holland is here to discuss her progress with her obesity treatment plan along with follow-up of her obesity related diagnoses. Anna Holland is on the Category 2 Plan and states she is following her eating plan approximately 90% of the time. Anna Holland states she is walking and jogging for 60 minutes 3-4 times per week. ? ?Today's visit was #: 12 ?Starting weight: 235 lbs ?Starting date: 12/20/2020 ?Today's weight: 216 lbs ?Today's date: 08/01/2021 ?Total lbs lost to date: 39 ?Total lbs lost since last in-office visit: 0 ? ?Interim History: Increased cravings and hunger. Patient is frustrated as she states she is eating exactly on the plan except for rare occasions. Gained fat mass. Patient still awaiting Wegovy approval that Anna Holland started at the patient's last office visit for polyphagia. ? ?Subjective:  ? ?1. Insulin resistance ?Fasting insulin 9.0 three months; prior 23.5. ? ?2. Vitamin D deficiency ?Taking weekly Ergo with no issues or concerns. Last Vitamin D level was 40.0.  ? ?3. Polyphagia ?Patient still awaiting Wegovy approval that Anna Holland started at the patient's last office visit for polyphagia. ? ?4. At risk for malnutrition ?Anna Holland is at increased risk for malnutrition due to current eating habits. ? ?Assessment/Plan:  ?No orders of the defined types were placed in this encounter. ? ? ?There are no discontinued medications.  ? ?No orders of the defined types were placed in this encounter. ?  ? ?1. Insulin resistance ?PNP decrease simple carbs, increase proteins, and no skipping meals. Ninamarie will continue to work on weight loss, exercise, and decreasing simple carbohydrates to help decrease the risk of diabetes. Anna Holland agreed to follow-up with [place] as directed to closely monitor her progress. ? ?2. Vitamin D deficiency ?We will refill prescription Vitamin D 50,000 IU every week and declines a need for a refill today. Anna Holland will follow-up for routine testing of Vitamin D, at  least 2-3 times per year to avoid over-replacement. ? ?3. Polyphagia ?Until medications are approved; start journaling to increase awareness of quality of food she is eating. Must increase protein to decrease hunger and cravings, and stay within calorie goals.  ? ?4. At risk for malnutrition ?Anna Holland was given approximately 9 minutes of counseling today regarding prevention of malnutrition and ways to meet macronutrient goals..  ? ?5. Obesity with current BMI of 42.3 ?Anna Holland is currently in the action stage of change. As such, her goal is to continue with weight loss efforts. She has agreed to keeping a food journal and adhering to recommended goals of 1000-1100 calories and 90+ grams of protein daily.  ? ?Bring in journal log to next office visit. ? ?Exercise goals: As is. ? ?Behavioral modification strategies: increasing lean protein intake, decreasing simple carbohydrates, increasing water intake, decreasing eating out, and no skipping meals. ? ?Anna Holland has agreed to follow-up with our clinic in 2 to 3 weeks with Anna Limbo, NP. She was informed of the importance of frequent follow-up visits to maximize her success with intensive lifestyle modifications for her multiple health conditions.  ? ?Objective:  ? ?Blood pressure 122/73, pulse 71, temperature 98.5 ?F (36.9 ?C), height 5' (1.524 m), weight 216 lb (98 kg), SpO2 98 %. ?Body mass index is 42.18 kg/m?. ? ?General: Cooperative, alert, well developed, in no acute distress. ?HEENT: Conjunctivae and lids unremarkable. ?Cardiovascular: Regular rhythm.  ?Lungs: Normal work of breathing. ?Neurologic: No focal deficits.  ? ?Lab Results  ?Component Value Date  ? CREATININE 0.56 (L) 04/17/2021  ? BUN 10  04/17/2021  ? NA 139 04/17/2021  ? K 4.3 04/17/2021  ? CL 102 04/17/2021  ? CO2 23 04/17/2021  ? ?Lab Results  ?Component Value Date  ? ALT 11 04/17/2021  ? AST 15 04/17/2021  ? ALKPHOS 101 04/17/2021  ? BILITOT 0.3 04/17/2021  ? ?Lab Results  ?Component Value  Date  ? HGBA1C 5.4 04/17/2021  ? HGBA1C 5.4 12/20/2020  ? ?Lab Results  ?Component Value Date  ? INSULIN 9.0 04/17/2021  ? INSULIN 23.5 12/20/2020  ? ?Lab Results  ?Component Value Date  ? TSH 1.650 12/20/2020  ? ?Lab Results  ?Component Value Date  ? CHOL 184 04/17/2021  ? HDL 35 (L) 04/17/2021  ? LDLCALC 133 (H) 04/17/2021  ? LDLDIRECT 126.0 09/02/2017  ? TRIG 88 04/17/2021  ? CHOLHDL 5.3 (H) 04/17/2021  ? ?Lab Results  ?Component Value Date  ? VD25OH 40.0 04/17/2021  ? VD25OH 26.2 (L) 12/20/2020  ? VD25OH 19.70 (L) 06/13/2020  ? ?Lab Results  ?Component Value Date  ? WBC 9.5 06/13/2020  ? HGB 12.5 06/13/2020  ? HCT 37.3 06/13/2020  ? MCV 78.7 06/13/2020  ? PLT 383.0 06/13/2020  ? ?No results found for: IRON, TIBC, FERRITIN ? ?Attestation Statements:  ? ?Reviewed by clinician on day of visit: allergies, medications, problem list, medical history, surgical history, family history, social history, and previous encounter notes. ? ? ?I, Anna Holland, am acting as transcriptionist for [employer], DO. ? ?I have reviewed the above documentation for accuracy and completeness, and I agree with the above. Anna Holland, D.O. ? ?The 21st Century Cures Act was signed into law in 2016 which includes the topic of electronic health records.  This provides immediate access to information in MyChart.  This includes consultation notes, operative notes, office notes, lab results and pathology reports.  If you have any questions about what you read please let us know at your next visit so we can discuss your concerns and take corrective action if need be.  We are right here with you. ?  ?

## 2021-08-27 ENCOUNTER — Ambulatory Visit (INDEPENDENT_AMBULATORY_CARE_PROVIDER_SITE_OTHER): Payer: BC Managed Care – PPO | Admitting: Family Medicine

## 2021-08-27 ENCOUNTER — Encounter (INDEPENDENT_AMBULATORY_CARE_PROVIDER_SITE_OTHER): Payer: Self-pay | Admitting: Family Medicine

## 2021-08-27 VITALS — BP 110/67 | HR 86 | Temp 98.2°F | Ht 60.0 in | Wt 208.0 lb

## 2021-08-27 DIAGNOSIS — E669 Obesity, unspecified: Secondary | ICD-10-CM

## 2021-08-27 DIAGNOSIS — E559 Vitamin D deficiency, unspecified: Secondary | ICD-10-CM | POA: Diagnosis not present

## 2021-08-27 DIAGNOSIS — Z9189 Other specified personal risk factors, not elsewhere classified: Secondary | ICD-10-CM

## 2021-08-27 DIAGNOSIS — Z6841 Body Mass Index (BMI) 40.0 and over, adult: Secondary | ICD-10-CM | POA: Diagnosis not present

## 2021-08-27 DIAGNOSIS — Z7985 Long-term (current) use of injectable non-insulin antidiabetic drugs: Secondary | ICD-10-CM

## 2021-08-27 MED ORDER — VITAMIN D (ERGOCALCIFEROL) 1.25 MG (50000 UNIT) PO CAPS: 50000.0000 [IU] | ORAL_CAPSULE | ORAL | 0 refills | Status: DC

## 2021-08-27 MED ORDER — WEGOVY 0.25 MG/0.5ML ~~LOC~~ SOAJ: 0.2500 mg | SUBCUTANEOUS | 0 refills | Status: DC

## 2021-09-04 NOTE — Progress Notes (Signed)
Chief Complaint:   OBESITY Anna Holland is here to discuss her progress with her obesity treatment plan along with follow-up of her obesity related diagnoses. [name] is on keeping a food journal and adhering to recommended goals of 1000-1100 calories and 90 grams protein and states she is following her eating plan approximately 90% of the time. [name] states she is walking the dog 30-60 minutes 3-4 times per week.  Today's visit was #: 8 Starting weight: 235 lbs Starting date: 12/20/2020 Today's weight: 208 lbs Today's date: 08/27/2021 Total lbs lost to date: 27 Total lbs lost since last in-office visit: 8  Interim History: Anna Holland is here for a follow up office visit. We reviewed her meal plan and all questions were answered. Patient's food recall appears to be accurate and consistent with what is on plan when she is following it. When eating on plan, her hunger and cravings are well controlled.    Subjective:   1. Vitamin D insufficiency She is currently taking prescription vitamin D 50,000 IU each week. She denies nausea, vomiting or muscle weakness. Pt's Vit D level was 40 about 4 months ago.  2. At risk for deficient intake of food Maxx is at risk for deficient intake of food due to skipping meals.  Assessment/Plan:  No orders of the defined types were placed in this encounter.   Medications Discontinued During This Encounter  Medication Reason   Vitamin D, Ergocalciferol, (DRISDOL) 1.25 MG (50000 UNIT) CAPS capsule Reorder   Semaglutide-Weight Management (WEGOVY) 0.25 MG/0.5ML SOAJ Reorder     Meds ordered this encounter  Medications   Semaglutide-Weight Management (WEGOVY) 0.25 MG/0.5ML SOAJ    Sig: Inject 0.25 mg into the skin once a week.    Dispense:  2 mL    Refill:  0   Vitamin D, Ergocalciferol, (DRISDOL) 1.25 MG (50000 UNIT) CAPS capsule    Sig: Take 1 capsule (50,000 Units total) by mouth every 7 (seven) days.    Dispense:  4 capsule    Refill:  0      1. Vitamin D insufficiency Low Vitamin D level contributes to fatigue and are associated with obesity, breast, and colon cancer. She agrees to continue to take prescription Vitamin D @50 ,000 IU every week and will follow-up for routine testing of Vitamin D, at least 2-3 times per year to avoid over-replacement.  Refill- Vitamin D, Ergocalciferol, (DRISDOL) 1.25 MG (50000 UNIT) CAPS capsule; Take 1 capsule (50,000 Units total) by mouth every 7 (seven) days.  Dispense: 4 capsule; Refill: 0  2. At risk for deficient intake of food Brinleigh was given extensive education and counseling today of more than 10 minutes on risks associated with deficient food intake. Counseled her on the importance of following our prescribed meal plan and eating adequate amounts of protein. Discussed with Mickle Plumb that inadequate food intake over longer periods of time can slow their metabolism down significantly.   3. Obesity with current BMI of 42.0, with Polyphagia [name] is currently in the action stage of change. As such, her goal is to continue with weight loss efforts. She has agreed to keeping a food journal and adhering to recommended goals of 1000-1100 calories and 90+ grams protein.   Consider rechecking labs at next OV.  Pt started [facility] on 08/07/2021. She reports it helps with hunger and denies side effects. She is not getting all foods in everyday. Continue Wegovy with no change in dose. No skipping meals or on-plan foods. Follow prudent  nutritional plan.  Refill- Semaglutide-Weight Management (WEGOVY) 0.25 MG/0.5ML SOAJ; Inject 0.25 mg into the skin once a week.  Dispense: 2 mL; Refill: 0  Exercise goals:  As is  Behavioral modification strategies: planning for success and keeping a strict food journal.  Anna Holland has agreed to follow-up with our clinic in 3 weeks. She was informed of the importance of frequent follow-up visits to maximize her success with intensive lifestyle modifications for her  multiple health conditions.   Objective:   Blood pressure 110/67, pulse 86, temperature 98.2 F (36.8 C), height 5' (1.524 m), weight 208 lb (94.3 kg), SpO2 98 %. Body mass index is 40.62 kg/m.  General: Cooperative, alert, well developed, in no acute distress. HEENT: Conjunctivae and lids unremarkable. Cardiovascular: Regular rhythm.  Lungs: Normal work of breathing. Neurologic: No focal deficits.   Lab Results  Component Value Date   CREATININE 0.56 (L) 04/17/2021   BUN 10 04/17/2021   NA 139 04/17/2021   K 4.3 04/17/2021   CL 102 04/17/2021   CO2 23 04/17/2021   Lab Results  Component Value Date   ALT 11 04/17/2021   AST 15 04/17/2021   ALKPHOS 101 04/17/2021   BILITOT 0.3 04/17/2021   Lab Results  Component Value Date   HGBA1C 5.4 04/17/2021   HGBA1C 5.4 12/20/2020   Lab Results  Component Value Date   INSULIN 9.0 04/17/2021   INSULIN 23.5 12/20/2020   Lab Results  Component Value Date   TSH 1.650 12/20/2020   Lab Results  Component Value Date   CHOL 184 04/17/2021   HDL 35 (L) 04/17/2021   LDLCALC 133 (H) 04/17/2021   LDLDIRECT 126.0 09/02/2017   TRIG 88 04/17/2021   CHOLHDL 5.3 (H) 04/17/2021   Lab Results  Component Value Date   VD25OH 40.0 04/17/2021   VD25OH 26.2 (L) 12/20/2020   VD25OH 19.70 (L) 06/13/2020   Lab Results  Component Value Date   WBC 9.5 06/13/2020   HGB 12.5 06/13/2020   HCT 37.3 06/13/2020   MCV 78.7 06/13/2020   PLT 383.0 06/13/2020    Attestation Statements:   Reviewed by clinician on day of visit: allergies, medications, problem list, medical history, surgical history, family history, social history, and previous encounter notes.  I, Kathlene November, BS, CMA, am acting as transcriptionist for [employer], DO.  I have reviewed the above documentation for accuracy and completeness, and I agree with the above. Marjory Sneddon, D.O.  The [place] was signed into law in 2016 which includes the  topic of electronic health records.  This provides immediate access to information in MyChart.  This includes consultation notes, operative notes, office notes, lab results and pathology reports.  If you have any questions about what you read please let us know at your next visit so we can discuss your concerns and take corrective action if need be.  We are right here with you.

## 2021-09-16 ENCOUNTER — Encounter (INDEPENDENT_AMBULATORY_CARE_PROVIDER_SITE_OTHER): Payer: Self-pay | Admitting: Nurse Practitioner

## 2021-09-16 ENCOUNTER — Ambulatory Visit (INDEPENDENT_AMBULATORY_CARE_PROVIDER_SITE_OTHER): Payer: BC Managed Care – PPO | Admitting: Nurse Practitioner

## 2021-09-16 VITALS — BP 99/69 | HR 66 | Temp 98.1°F | Ht 60.0 in | Wt 205.0 lb

## 2021-09-16 DIAGNOSIS — E669 Obesity, unspecified: Secondary | ICD-10-CM | POA: Diagnosis not present

## 2021-09-16 DIAGNOSIS — E8881 Metabolic syndrome: Secondary | ICD-10-CM

## 2021-09-16 DIAGNOSIS — Z6841 Body Mass Index (BMI) 40.0 and over, adult: Secondary | ICD-10-CM | POA: Diagnosis not present

## 2021-09-16 DIAGNOSIS — R632 Polyphagia: Secondary | ICD-10-CM

## 2021-09-16 MED ORDER — WEGOVY 0.25 MG/0.5ML ~~LOC~~ SOAJ: 0.2500 mg | SUBCUTANEOUS | 0 refills | Status: DC

## 2021-09-17 NOTE — Progress Notes (Unsigned)
Chief Complaint:   OBESITY Anna Holland is here to discuss her progress with her obesity treatment plan along with follow-up of her obesity related diagnoses. Anna Holland is on keeping a food journal and adhering to recommended goals of 1000-1100 calories and 90+ grams of protein and states she is following her eating plan approximately 90% of the time. [name] states she is doing 0 minutes 0 times per week.  Today's visit was #: 14 Starting weight: 235 lbs Starting date: 12/20/2020 Today's weight: 205 lbs Today's date: 09/16/2021 Total lbs lost to date: 30 Total lbs lost since last in-office visit: 3  Interim History: Anna Holland has done well with weight loss since her last visit.  She has gone to to graduation parties since her last visit.  She is taking Wegovy 0.25 mg and she denies side effects.  She is averaging around 1000-1100 cal and 95 g of protein daily.  She denies hunger or cravings except around her menses.  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  She has not been exercising since starting the extern program.  Subjective:   1. Insulin resistance [name]'s last insulin look better at 9.0.  She is taking Wegovy 0.25 mg and she denies side effects.  She denies hunger or cravings.  2. Anna Holland is doing well on Wegovy 0.25 mg, and she denies side effects.  Assessment/Plan:   1. Insulin resistance [name] will continue to work on weight loss, exercise, and decreasing simple carbohydrates to help decrease the risk of diabetes. [name] agreed to follow-up with [place] as directed to closely monitor her progress.  2. [name] will continue Wegovy at 0.25 mg.  3. Obesity with current BMI of 42.0 [name] is currently in the action stage of change. As such, her goal is to continue with weight loss efforts. She has agreed to keeping a food journal and adhering to recommended goals of 1000-1100 calories and 90+ grams of protein  daily.   We discussed various medication options to help Anna Holland with her weight loss efforts and we both agreed to continue Wegovy 0.25 mg, and we will refill for 1 month. Side effects were discussed.  - Semaglutide-Weight Management (WEGOVY) 0.25 MG/0.5ML SOAJ; Inject 0.25 mg into the skin once a week.  Dispense: 2 mL; Refill: 0  Exercise goals: No exercise has been prescribed at this time.  Behavioral modification strategies: increasing water intake, no skipping meals, and planning for success.  Anna Holland has agreed to follow-up with our clinic in 2 weeks. She was informed of the importance of frequent follow-up visits to maximize her success with intensive lifestyle modifications for her multiple health conditions.   Objective:   Blood pressure 99/69, pulse 66, temperature 98.1 F (36.7 C), height 5' (1.524 m), weight 205 lb (93 kg), SpO2 98 %. Body mass index is 40.04 kg/m.  General: Cooperative, alert, well developed, in no acute distress. HEENT: Conjunctivae and lids unremarkable. Cardiovascular: Regular rhythm.  Lungs: Normal work of breathing. Neurologic: No focal deficits.   Lab Results  Component Value Date   CREATININE 0.56 (L) 04/17/2021   BUN 10 04/17/2021   NA 139 04/17/2021   K 4.3 04/17/2021   CL 102 04/17/2021   CO2 23 04/17/2021   Lab Results  Component Value Date   ALT 11 04/17/2021   AST 15 04/17/2021   ALKPHOS 101 04/17/2021   BILITOT 0.3 04/17/2021   Lab Results  Component Value Date  HGBA1C 5.4 04/17/2021   HGBA1C 5.4 12/20/2020   Lab Results  Component Value Date   INSULIN 9.0 04/17/2021   INSULIN 23.5 12/20/2020   Lab Results  Component Value Date   TSH 1.650 12/20/2020   Lab Results  Component Value Date   CHOL 184 04/17/2021   HDL 35 (L) 04/17/2021   LDLCALC 133 (H) 04/17/2021   LDLDIRECT 126.0 09/02/2017   TRIG 88 04/17/2021   CHOLHDL 5.3 (H) 04/17/2021   Lab Results  Component Value Date   VD25OH 40.0 04/17/2021   VD25OH  26.2 (L) 12/20/2020   VD25OH 19.70 (L) 06/13/2020   Lab Results  Component Value Date   WBC 9.5 06/13/2020   HGB 12.5 06/13/2020   HCT 37.3 06/13/2020   MCV 78.7 06/13/2020   PLT 383.0 06/13/2020   No results found for: "IRON", "TIBC", "FERRITIN"  Attestation Statements:   Reviewed by clinician on day of visit: allergies, medications, problem list, medical history, surgical history, family history, social history, and previous encounter notes.   Trude Mcburney, am acting as Energy manager for [employer], FNP-C.  I have reviewed the above documentation for accuracy and completeness, and I agree with the above. Irene Limbo, FNP

## 2021-09-23 ENCOUNTER — Other Ambulatory Visit: Payer: Self-pay

## 2021-09-23 DIAGNOSIS — F419 Anxiety disorder, unspecified: Secondary | ICD-10-CM

## 2021-09-23 MED ORDER — BUPROPION HCL ER (XL) 150 MG PO TB24: 150.0000 mg | ORAL_TABLET | Freq: Every day | ORAL | 3 refills | Status: DC

## 2021-09-23 MED ORDER — CITALOPRAM HYDROBROMIDE 20 MG PO TABS: 20.0000 mg | ORAL_TABLET | Freq: Every day | ORAL | 3 refills | Status: DC

## 2021-09-24 ENCOUNTER — Ambulatory Visit (INDEPENDENT_AMBULATORY_CARE_PROVIDER_SITE_OTHER): Payer: BC Managed Care – PPO | Admitting: Family Medicine

## 2021-10-07 ENCOUNTER — Encounter (INDEPENDENT_AMBULATORY_CARE_PROVIDER_SITE_OTHER): Payer: Self-pay | Admitting: Nurse Practitioner

## 2021-10-07 ENCOUNTER — Ambulatory Visit (INDEPENDENT_AMBULATORY_CARE_PROVIDER_SITE_OTHER): Payer: BC Managed Care – PPO | Admitting: Nurse Practitioner

## 2021-10-07 VITALS — BP 105/69 | HR 94 | Temp 98.8°F | Ht 60.0 in | Wt 204.0 lb

## 2021-10-07 DIAGNOSIS — Z6841 Body Mass Index (BMI) 40.0 and over, adult: Secondary | ICD-10-CM | POA: Diagnosis not present

## 2021-10-07 DIAGNOSIS — E559 Vitamin D deficiency, unspecified: Secondary | ICD-10-CM

## 2021-10-07 DIAGNOSIS — E669 Obesity, unspecified: Secondary | ICD-10-CM

## 2021-10-07 MED ORDER — VITAMIN D (ERGOCALCIFEROL) 1.25 MG (50000 UNIT) PO CAPS: 50000.0000 [IU] | ORAL_CAPSULE | ORAL | 0 refills | Status: DC

## 2021-10-07 MED ORDER — INSULIN PEN NEEDLE 31G X 5 MM MISC: 0 refills | Status: DC

## 2021-10-07 MED ORDER — SAXENDA 18 MG/3ML ~~LOC~~ SOPN: 3.0000 mg | PEN_INJECTOR | Freq: Every day | SUBCUTANEOUS | 0 refills | Status: DC

## 2021-10-09 NOTE — Progress Notes (Signed)
Chief Complaint:   OBESITY Anna Holland is here to discuss her progress with her obesity treatment plan along with follow-up of her obesity related diagnoses. Anna Holland is on keeping a food journal and adhering to recommended goals of 1000-1100 calories and 90+ grams of protein daily and states she is following her eating plan approximately 80% of the time. Anna Holland states she is doing 0 minutes 0 times per week.  Today's visit was #: 15 Starting weight: 235 lbs Starting date: 12/20/2020 Today's weight: 204 lbs Today's date: 10/07/2021 Total lbs lost to date: 31 Total lbs lost since last in-office visit: 1  Interim History: Anna Holland was surprised that she lost weight.  She is out of school for the summer and she has been going out more with her friends and eating out more.  She is going on vacation on July 15.  She is drinking water, coffee, and unsweetened tea.  She stopped Wegovy 2 weeks ago due to shortage.  She notes some hunger and cravings.  Subjective:   1. Vitamin D insufficiency Anna Holland is taking prescription vitamin D 50,000 units once weekly.  She denies side effects of nausea, vomiting, or muscle weakness.  Assessment/Plan:   1. Vitamin D insufficiency Anna Holland will continue prescription vitamin D 50,000 units once weekly, and we will refill for 1 month.  Side effects were discussed.  - Vitamin D, Ergocalciferol, (DRISDOL) 1.25 MG (50000 UNIT) CAPS capsule; Take 1 capsule (50,000 Units total) by mouth every 7 (seven) days.  Dispense: 4 capsule; Refill: 0  2. Obesity with current BMI of 40.0 Anna Holland is currently in the action stage of change. As such, her goal is to continue with weight loss efforts. She has agreed to the Category 2 Plan.   Dining out guide was given today.  We will obtain labs at her next visit.  We discussed various medication options to help Anna Holland with her weight loss efforts and we both agreed to start Saxenda at 0.6 mg subcu daily for 1 week, then increase to 1.2  mg daily, with no refills; pen needles #100 with no refills.  Side effects discussed.   - Liraglutide -Weight Management (SAXENDA) 18 MG/3ML SOPN; Inject 3 mg into the skin daily.  Dispense: 15 mL; Refill: 0 - Insulin Pen Needle 31G X 5 MM MISC; Use as directed with Saxenda  Dispense: 100 each; Refill: 0  Exercise goals: Walking  Behavioral modification strategies: increasing lean protein intake, increasing water intake, and planning for success.  Anna Holland has agreed to follow-up with our clinic in 3 weeks. She was informed of the importance of frequent follow-up visits to maximize her success with intensive lifestyle modifications for her multiple health conditions.   Objective:   Blood pressure 105/69, pulse 94, temperature 98.8 F (37.1 C), height 5' (1.524 m), weight 204 lb (92.5 kg), SpO2 97 %. Body mass index is 39.84 kg/m.  General: Cooperative, alert, well developed, in no acute distress. HEENT: Conjunctivae and lids unremarkable. Cardiovascular: Regular rhythm.  Lungs: Normal work of breathing. Neurologic: No focal deficits.   Lab Results  Component Value Date   CREATININE 0.56 (L) 04/17/2021   BUN 10 04/17/2021   NA 139 04/17/2021   K 4.3 04/17/2021   CL 102 04/17/2021   CO2 23 04/17/2021   Lab Results  Component Value Date   ALT 11 04/17/2021   AST 15 04/17/2021   ALKPHOS 101 04/17/2021   BILITOT 0.3 04/17/2021   Lab Results  Component Value Date  HGBA1C 5.4 04/17/2021   HGBA1C 5.4 12/20/2020   Lab Results  Component Value Date   INSULIN 9.0 04/17/2021   INSULIN 23.5 12/20/2020   Lab Results  Component Value Date   TSH 1.650 12/20/2020   Lab Results  Component Value Date   CHOL 184 04/17/2021   HDL 35 (L) 04/17/2021   LDLCALC 133 (H) 04/17/2021   LDLDIRECT 126.0 09/02/2017   TRIG 88 04/17/2021   CHOLHDL 5.3 (H) 04/17/2021   Lab Results  Component Value Date   VD25OH 40.0 04/17/2021   VD25OH 26.2 (L) 12/20/2020   VD25OH 19.70 (L) 06/13/2020    Lab Results  Component Value Date   WBC 9.5 06/13/2020   HGB 12.5 06/13/2020   HCT 37.3 06/13/2020   MCV 78.7 06/13/2020   PLT 383.0 06/13/2020   No results found for: "IRON", "TIBC", "FERRITIN"  Attestation Statements:   Reviewed by clinician on day of visit: allergies, medications, problem list, medical history, surgical history, family history, social history, and previous encounter notes.   Trude Mcburney, am acting as Energy manager for [employer], FNP-C.  I have reviewed the above documentation for accuracy and completeness, and I agree with the above. Irene Limbo, FNP

## 2021-10-10 ENCOUNTER — Ambulatory Visit (INDEPENDENT_AMBULATORY_CARE_PROVIDER_SITE_OTHER): Payer: BC Managed Care – PPO | Admitting: Nurse Practitioner

## 2021-10-24 ENCOUNTER — Ambulatory Visit: Payer: BC Managed Care – PPO | Admitting: Family Medicine

## 2021-10-24 ENCOUNTER — Encounter: Payer: Self-pay | Admitting: Family Medicine

## 2021-10-24 VITALS — BP 126/80 | HR 85 | Temp 98.8°F | Resp 17 | Ht 61.0 in | Wt 216.2 lb

## 2021-10-24 DIAGNOSIS — Z111 Encounter for screening for respiratory tuberculosis: Secondary | ICD-10-CM | POA: Diagnosis not present

## 2021-10-24 DIAGNOSIS — Z79899 Other long term (current) drug therapy: Secondary | ICD-10-CM | POA: Insufficient documentation

## 2021-10-24 DIAGNOSIS — M79671 Pain in right foot: Secondary | ICD-10-CM | POA: Diagnosis not present

## 2021-10-24 DIAGNOSIS — L2084 Intrinsic (allergic) eczema: Secondary | ICD-10-CM | POA: Insufficient documentation

## 2021-10-24 MED ORDER — MELOXICAM 15 MG PO TABS: 15.0000 mg | ORAL_TABLET | Freq: Every day | ORAL | 1 refills | Status: AC

## 2021-10-24 NOTE — Assessment & Plan Note (Signed)
Restart Meloxicam for daily relief of pain and inflammation.  Pt advised not to take additional NSAIDs but to use tylenol for breakthrough pain.  Pt expressed understanding and is in agreement w/ plan.

## 2021-10-24 NOTE — Progress Notes (Signed)
   Subjective:    Patient ID: Anna Holland, female    DOB: 10-Apr-2000, 21 y.o.   MRN: [id number]  HPI R foot pain- pt was dx'd w/ ganglion cyst.  Has had to take 'double amount' of ibuprofen to control pain w/ walking.  Was previously on Meloxicam but ran out.  TB screen- pt needs test for school   Review of Systems For ROS see HPI     Objective:   Physical Exam Vitals reviewed.  Constitutional:      General: She is not in acute distress.    Appearance: Normal appearance. She is not ill-appearing.  HENT:     Head: Normocephalic and atraumatic.  Musculoskeletal:     Right lower leg: No edema.     Left lower leg: No edema.  Skin:    General: Skin is warm and dry.  Neurological:     General: No focal deficit present.     Mental Status: She is alert and oriented to person, place, and time.  Psychiatric:        Mood and Affect: Mood normal.        Behavior: Behavior normal.        Thought Content: Thought content normal.           Assessment & Plan:  TB screen needed- quantiferon drawn today

## 2021-10-24 NOTE — Patient Instructions (Addendum)
Schedule your complete physical in 6 months START the Meloxicam once daily- take w/ food For breakthrough joint pain, use Tylenol (Acetaminophen) We'll notify you of your TB test Call with any questions or concerns Enjoy the rest of your summer!!!

## 2021-10-28 ENCOUNTER — Telehealth: Payer: Self-pay

## 2021-10-28 LAB — QUANTIFERON-TB GOLD PLUS
Mitogen-NIL: 2.46 IU/mL
NIL: 0.04 IU/mL
QuantiFERON-TB Gold Plus: NEGATIVE
TB1-NIL: <0 IU/mL
TB2-NIL: <0 IU/mL

## 2021-10-28 NOTE — Telephone Encounter (Signed)
Informed pt of TB results

## 2021-10-28 NOTE — Telephone Encounter (Signed)
-----   Message from Sheliah Hatch, MD sent at 10/28/2021  7:27 AM EDT ----- Negative (normal) TB test- great news!

## 2021-10-29 ENCOUNTER — Ambulatory Visit: Payer: BC Managed Care – PPO | Admitting: Family Medicine

## 2021-10-31 ENCOUNTER — Encounter (INDEPENDENT_AMBULATORY_CARE_PROVIDER_SITE_OTHER): Payer: Self-pay | Admitting: Family Medicine

## 2021-10-31 ENCOUNTER — Ambulatory Visit (INDEPENDENT_AMBULATORY_CARE_PROVIDER_SITE_OTHER): Payer: BC Managed Care – PPO | Admitting: Family Medicine

## 2021-10-31 VITALS — BP 120/78 | HR 68 | Temp 98.1°F | Ht 61.0 in | Wt 206.0 lb

## 2021-10-31 DIAGNOSIS — Z6839 Body mass index (BMI) 39.0-39.9, adult: Secondary | ICD-10-CM

## 2021-10-31 DIAGNOSIS — R632 Polyphagia: Secondary | ICD-10-CM

## 2021-10-31 DIAGNOSIS — E66813 Obesity, class 3: Secondary | ICD-10-CM

## 2021-10-31 DIAGNOSIS — E785 Hyperlipidemia, unspecified: Secondary | ICD-10-CM

## 2021-10-31 DIAGNOSIS — E786 Lipoprotein deficiency: Secondary | ICD-10-CM

## 2021-10-31 DIAGNOSIS — E559 Vitamin D deficiency, unspecified: Secondary | ICD-10-CM | POA: Diagnosis not present

## 2021-10-31 DIAGNOSIS — E669 Obesity, unspecified: Secondary | ICD-10-CM

## 2021-10-31 DIAGNOSIS — E8881 Metabolic syndrome: Secondary | ICD-10-CM

## 2021-10-31 DIAGNOSIS — E88819 Insulin resistance, unspecified: Secondary | ICD-10-CM

## 2021-10-31 MED ORDER — VITAMIN D (ERGOCALCIFEROL) 1.25 MG (50000 UNIT) PO CAPS: 50000.0000 [IU] | ORAL_CAPSULE | ORAL | 0 refills | Status: DC

## 2021-11-01 LAB — COMPREHENSIVE METABOLIC PANEL
ALT: 12 IU/L (ref 0–32)
AST: 13 IU/L (ref 0–40)
Albumin/Globulin Ratio: 1.2 (ref 1.2–2.2)
Albumin: 4.1 g/dL (ref 4.0–5.0)
Alkaline Phosphatase: 97 IU/L (ref 44–121)
BUN/Creatinine Ratio: 21 (ref 9–23)
BUN: 12 mg/dL (ref 6–20)
Bilirubin Total: 0.2 mg/dL (ref 0.0–1.2)
CO2: 23 mmol/L (ref 20–29)
Calcium: 9.5 mg/dL (ref 8.7–10.2)
Chloride: 99 mmol/L (ref 96–106)
Creatinine, Ser: 0.57 mg/dL (ref 0.57–1.00)
Globulin, Total: 3.5 g/dL (ref 1.5–4.5)
Glucose: 74 mg/dL (ref 70–99)
Potassium: 4.4 mmol/L (ref 3.5–5.2)
Sodium: 138 mmol/L (ref 134–144)
Total Protein: 7.6 g/dL (ref 6.0–8.5)
eGFR: 132 mL/min/{1.73_m2} (ref >59)

## 2021-11-01 LAB — LIPID PANEL
Chol/HDL Ratio: 4.8 ratio — ABNORMAL HIGH (ref 0.0–4.4)
Cholesterol, Total: 197 mg/dL (ref 100–199)
HDL: 41 mg/dL (ref >39)
LDL Chol Calc (NIH): 140 mg/dL — ABNORMAL HIGH (ref 0–99)
Triglycerides: 87 mg/dL (ref 0–149)
VLDL Cholesterol Cal: 16 mg/dL (ref 5–40)

## 2021-11-01 LAB — HEMOGLOBIN A1C
Est. average glucose Bld gHb Est-mCnc: 100 mg/dL
Hgb A1c MFr Bld: 5.1 % (ref 4.8–5.6)

## 2021-11-01 LAB — VITAMIN D 25 HYDROXY (VIT D DEFICIENCY, FRACTURES): Vit D, 25-Hydroxy: 43.3 ng/mL (ref 30.0–100.0)

## 2021-11-01 LAB — INSULIN, RANDOM: INSULIN: 11.2 u[IU]/mL (ref 2.6–24.9)

## 2021-11-05 NOTE — Progress Notes (Signed)
Chief Complaint:   OBESITY Anna Holland is here to discuss her progress with her obesity treatment plan along with follow-up of her obesity related diagnoses. Anna Holland is on the Category 2 Plan and states she is following her eating plan approximately 75% of the time. Anna Holland states she is walking 30 minutes 2-3 times per week.  Today's visit was #: 16 Starting weight: 235 lbs Starting date: 12/20/2020 Today's weight: 206 lbs Today's date: 10/31/2021 Total lbs lost to date: 29 Total lbs lost since last in-office visit: +2  Interim History: Anna Holland [place] the [place]. Anna Holland is here for a follow up office visit.  We reviewed her meal plan and all questions were answered.  Patient's food recall appears to be accurate and consistent with what is on plan when she is following it.   When eating on plan, her hunger and cravings are well controlled.    Subjective:   1. Polyphagia Anna Holland was off [facility] for 2 months over April and June due to shortage, and she saw NP Judeth Cornfield for her last OV was prescribed Saxenda. Pt also reports being unable to find Saxenda at [facility] and [facility].  2. Vitamin D insufficiency Pt's Vit D level was 40 in January 2023. She takes Ergocalciferol 50% of the time, as she reports she just doesn't like to take pills.  3. Insulin resistance Goal is HgbA1c < 5.7, fasting insulin closer to 5. She is not on any meds, except Saxenda that was ordered.  4. Hyperlipidemia with low HDL Anna Holland has hyperlipidemia and has been trying to improve her cholesterol levels with intensive lifestyle modifications including a low saturated fat diet, exercise, and weight loss. She denies any chest pain, claudication, or myalgias. Medication: None  Assessment/Plan:   Orders Placed This Encounter  Procedures   VITAMIN D 25 Hydroxy (Vit-D Deficiency, Fractures)   Lipid panel   Hemoglobin A1c   Insulin, random   Comprehensive metabolic panel    Medications  Discontinued During This Encounter  Medication Reason   Vitamin D, Ergocalciferol, (DRISDOL) 1.25 MG (50000 UNIT) CAPS capsule Reorder     Meds ordered this encounter  Medications   Vitamin D, Ergocalciferol, (DRISDOL) 1.25 MG (50000 UNIT) CAPS capsule    Sig: Take 1 capsule (50,000 Units total) by mouth every 7 (seven) days.    Dispense:  4 capsule    Refill:  0     1. Polyphagia Intensive lifestyle modifications are the first line treatment for this issue. We discussed several lifestyle modifications today and she will continue to work on diet, exercise and weight loss efforts. Orders and follow up as documented in patient record. Strategies to locate medication discussed with pt in detail.  Counseling Polyphagia is excessive hunger. Causes can include: low blood sugars, hypERthyroidism, PMS, lack of sleep, stress, insulin resistance, diabetes, certain medications, and diets that are deficient in protein and fiber.   2. Vitamin D insufficiency Low Vitamin D level contributes to fatigue and are associated with obesity, breast, and colon cancer. She agrees to continue to take prescription Vitamin D 50,000 IU every week and will follow-up for routine testing of Vitamin D, at least 2-3 times per year to avoid over-replacement.  Refill- Vitamin D, Ergocalciferol, (DRISDOL) 1.25 MG (50000 UNIT) CAPS capsule; Take 1 capsule (50,000 Units total) by mouth every 7 (seven) days.  Dispense: 4 capsule; Refill: 0  Lab/Orders today: - VITAMIN D 25 Hydroxy (Vit-D Deficiency, Fractures)  3. Insulin resistance  Anna Holland will continue to work on weight loss, exercise, and decreasing simple carbohydrates to help decrease the risk of diabetes. Anna Holland agreed to follow-up with [place] as directed to closely monitor her progress.  Lab/Orders today: - Hemoglobin A1c - Insulin, random  4. Hyperlipidemia with low HDL Cardiovascular risk and specific lipid/LDL goals reviewed.  We discussed several lifestyle  modifications today and Anna Holland will continue to work on diet, exercise and weight loss efforts. Orders and follow up as documented in patient record.   Counseling Intensive lifestyle modifications are the first line treatment for this issue. Dietary changes: Increase soluble fiber. Decrease simple carbohydrates. Exercise changes: Moderate to vigorous-intensity aerobic activity 150 minutes per week if tolerated. Lipid-lowering medications: see documented in medical record.  Lab/Orders today: - Lipid panel - Comprehensive metabolic panel  5. Obesity with current BMI of 39 Anna Holland is currently in the action stage of change. As such, her goal is to continue with weight loss efforts. She has agreed to the Category 2 Plan but journal intake.   Exercise goals: For substantial health benefits, adults should do at least 150 minutes (2 hours and 30 minutes) a week of moderate-intensity, or 75 minutes (1 hour and 15 minutes) a week of vigorous-intensity aerobic physical activity, or an equivalent combination of moderate- and vigorous-intensity aerobic activity. Aerobic activity should be performed in episodes of at least 10 minutes, and preferably, it should be spread throughout the week.  Behavioral modification strategies: planning for success.  Anna Holland has agreed to follow-up with our clinic in 2-3 weeks with NP Judeth Cornfield, per pt preference. She was informed of the importance of frequent follow-up visits to maximize her success with intensive lifestyle modifications for her multiple health conditions.   Anna Holland was informed we would discuss her lab results at her next visit unless there is a critical issue that needs to be addressed sooner. Anna Holland agreed to keep her next visit at the agreed upon time to discuss these results.  Objective:   Blood pressure 120/78, pulse 68, temperature 98.1 F (36.7 C), height 5\' 1"  (1.549 m), weight 206 lb (93.4 kg), SpO2 100 %. Body mass index is 38.92  kg/m.  General: Cooperative, alert, well developed, in no acute distress. HEENT: Conjunctivae and lids unremarkable. Cardiovascular: Regular rhythm.  Lungs: Normal work of breathing. Neurologic: No focal deficits.   Lab Results  Component Value Date   CREATININE 0.57 10/31/2021   BUN 12 10/31/2021   NA 138 10/31/2021   K 4.4 10/31/2021   CL 99 10/31/2021   CO2 23 10/31/2021   Lab Results  Component Value Date   ALT 12 10/31/2021   AST 13 10/31/2021   ALKPHOS 97 10/31/2021   BILITOT 0.2 10/31/2021   Lab Results  Component Value Date   HGBA1C 5.1 10/31/2021   HGBA1C 5.4 04/17/2021   HGBA1C 5.4 12/20/2020   Lab Results  Component Value Date   INSULIN 11.2 10/31/2021   INSULIN 9.0 04/17/2021   INSULIN 23.5 12/20/2020   Lab Results  Component Value Date   TSH 1.650 12/20/2020   Lab Results  Component Value Date   CHOL 197 10/31/2021   HDL 41 10/31/2021   LDLCALC 140 (H) 10/31/2021   LDLDIRECT 126.0 09/02/2017   TRIG 87 10/31/2021   CHOLHDL 4.8 (H) 10/31/2021   Lab Results  Component Value Date   VD25OH 43.3 10/31/2021   VD25OH 40.0 04/17/2021   VD25OH 26.2 (L) 12/20/2020   Lab Results  Component Value Date   WBC 9.5  06/13/2020   HGB 12.5 06/13/2020   HCT 37.3 06/13/2020   MCV 78.7 06/13/2020   PLT 383.0 06/13/2020    Attestation Statements:   Reviewed by clinician on day of visit: allergies, medications, problem list, medical history, surgical history, family history, social history, and previous encounter notes.  I, Kyung Rudd, BS, CMA, am acting as transcriptionist for [employer], DO.   I have reviewed the above documentation for accuracy and completeness, and I agree with the above. Carlye Grippe, D.O.  The 21st Century Cures Act was signed into law in 2016 which includes the topic of electronic health records.  This provides immediate access to information in MyChart.  This includes consultation notes, operative notes, office  notes, lab results and pathology reports.  If you have any questions about what you read please let us know at your next visit so we can discuss your concerns and take corrective action if need be.  We are right here with you.

## 2021-11-13 ENCOUNTER — Encounter (INDEPENDENT_AMBULATORY_CARE_PROVIDER_SITE_OTHER): Payer: Self-pay

## 2021-11-21 ENCOUNTER — Ambulatory Visit (INDEPENDENT_AMBULATORY_CARE_PROVIDER_SITE_OTHER): Payer: BC Managed Care – PPO | Admitting: Family Medicine

## 2021-11-26 ENCOUNTER — Ambulatory Visit (INDEPENDENT_AMBULATORY_CARE_PROVIDER_SITE_OTHER): Payer: BC Managed Care – PPO | Admitting: Nurse Practitioner

## 2021-11-27 ENCOUNTER — Ambulatory Visit (INDEPENDENT_AMBULATORY_CARE_PROVIDER_SITE_OTHER): Payer: BC Managed Care – PPO | Admitting: Nurse Practitioner

## 2021-11-27 ENCOUNTER — Encounter (INDEPENDENT_AMBULATORY_CARE_PROVIDER_SITE_OTHER): Payer: Self-pay | Admitting: Nurse Practitioner

## 2021-11-27 VITALS — BP 94/63 | HR 75 | Temp 99.1°F | Ht 61.0 in | Wt 208.0 lb

## 2021-11-27 DIAGNOSIS — E66813 Obesity, class 3: Secondary | ICD-10-CM

## 2021-11-27 DIAGNOSIS — E559 Vitamin D deficiency, unspecified: Secondary | ICD-10-CM

## 2021-11-27 DIAGNOSIS — Z6839 Body mass index (BMI) 39.0-39.9, adult: Secondary | ICD-10-CM

## 2021-11-27 DIAGNOSIS — E786 Lipoprotein deficiency: Secondary | ICD-10-CM

## 2021-11-27 DIAGNOSIS — E785 Hyperlipidemia, unspecified: Secondary | ICD-10-CM

## 2021-11-27 DIAGNOSIS — E669 Obesity, unspecified: Secondary | ICD-10-CM

## 2021-11-27 DIAGNOSIS — I959 Hypotension, unspecified: Secondary | ICD-10-CM | POA: Diagnosis not present

## 2021-12-04 NOTE — Progress Notes (Unsigned)
Chief Complaint:   OBESITY Anna Holland is here to discuss her progress with her obesity treatment plan along with follow-up of her obesity related diagnoses. Anna Holland is on the Category 2 Plan and states she is following her eating plan approximately 85% of the time. Anna Holland states she is exercising 0 minutes 0 times per week.  Today's visit was #: 17 Starting weight: 235 lbs Starting date: 12/20/2020 Today's weight: 208 lbs Today's date: 11/27/2021 Total lbs lost to date: 27 lbs Total lbs lost since last in-office visit: 0  Interim History: Anna Holland has been on vacation since her last visit. Has been under a lot of stress since her last visit. 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 Some stress eating and struggling with hunger and cravings. Drinking water, unsweetened tea and coffee. Anna Holland in the past. Was unable to start Saxenda due to shortage.   Subjective:   1. Vitamin D insufficiency Labs discussed during visit today. Anna Holland is currently taking prescription Vit D 50,000 IU every 2 weeks. Denies any side effects. Denies any nausea, vomiting or muscle weakness.  2. Hyperlipidemia with low HDL Labs discussed during visit today. Anna Holland has never been medication. Family history: Dad. HLD improved.  3. Hypotension, unspecified hypotension type Anna Holland concerned about blood pressure. Notes dizziness if stands up too fast or bends down. Family history: father, hypotension. Denies any chest pain,shortness of breath or palpitations.  Assessment/Plan:   1. Vitamin D insufficiency Anna Holland will continue Vit D as directed.  2. Hyperlipidemia with low HDL Cardiovascular risk and specific lipid/LDL goals reviewed.  We discussed several lifestyle modifications today and Anna Holland will continue to work on diet, exercise and weight loss efforts. Orders and follow up as documented in patient record.   Counseling Intensive lifestyle modifications are the first line treatment  for this issue. Dietary changes: Increase soluble fiber. Decrease simple carbohydrates. Exercise changes: Moderate to vigorous-intensity aerobic activity 150 minutes per week if tolerated. Lipid-lowering medications: see documented in medical record.   3. Hypotension, unspecified hypotension type Offered referral. Anna Holland wants to think about it.  4. Obesity with current BMI of 39.3 Anna Holland is currently in the action stage of change. As such, her goal is to continue with weight loss efforts. She has agreed to the Category 2 Plan.   Exercise goals: All adults should avoid inactivity. Some physical activity is better than none, and adults who participate in any amount of physical activity gain some health benefits.  Labs discussed during visit today. Try to find Saxenda and start 0.6mg  daily x 1 week and then increase to 1.2mg  if she doesn't have side effects.   Behavioral modification strategies: increasing lean protein intake, increasing vegetables, and increasing water intake.  Anna Holland has agreed to follow-up with our clinic in 3 weeks. She was informed of the importance of frequent follow-up visits to maximize her success with intensive lifestyle modifications for her multiple health conditions.   Objective:   Blood pressure 94/63, pulse 75, temperature 99.1 F (37.3 C), height 5\' 1"  (1.549 m), weight 208 lb (94.3 kg), SpO2 97 %. Body mass index is 39.3 kg/m.  General: Cooperative, alert, well developed, in no acute distress. HEENT: Conjunctivae and lids unremarkable. Cardiovascular: Regular rhythm.  Lungs: Normal work of breathing. Neurologic: No focal deficits.   Lab Results  Component Value Date   CREATININE 0.57 10/31/2021   BUN 12 10/31/2021   NA 138 10/31/2021   K 4.4 10/31/2021   CL 99 10/31/2021  CO2 23 10/31/2021   Lab Results  Component Value Date   ALT 12 10/31/2021   AST 13 10/31/2021   ALKPHOS 97 10/31/2021   BILITOT 0.2 10/31/2021   Lab Results   Component Value Date   HGBA1C 5.1 10/31/2021   HGBA1C 5.4 04/17/2021   HGBA1C 5.4 12/20/2020   Lab Results  Component Value Date   INSULIN 11.2 10/31/2021   INSULIN 9.0 04/17/2021   INSULIN 23.5 12/20/2020   Lab Results  Component Value Date   TSH 1.650 12/20/2020   Lab Results  Component Value Date   CHOL 197 10/31/2021   HDL 41 10/31/2021   LDLCALC 140 (H) 10/31/2021   LDLDIRECT 126.0 09/02/2017   TRIG 87 10/31/2021   CHOLHDL 4.8 (H) 10/31/2021   Lab Results  Component Value Date   VD25OH 43.3 10/31/2021   VD25OH 40.0 04/17/2021   VD25OH 26.2 (L) 12/20/2020   Lab Results  Component Value Date   WBC 9.5 06/13/2020   HGB 12.5 06/13/2020   HCT 37.3 06/13/2020   MCV 78.7 06/13/2020   PLT 383.0 06/13/2020   No results found for: "IRON", "TIBC", "FERRITIN"  Attestation Statements:   Reviewed by clinician on day of visit: allergies, medications, problem list, medical history, surgical history, family history, social history, and previous encounter notes.  I, Brendell Tyus, RMA, am acting as transcriptionist for Irene Limbo, FNP.  I have reviewed the above documentation for accuracy and completeness, and I agree with the above. Irene Limbo, FNP

## 2021-12-17 ENCOUNTER — Telehealth: Payer: BC Managed Care – PPO | Admitting: Family Medicine

## 2021-12-17 ENCOUNTER — Ambulatory Visit: Payer: BC Managed Care – PPO | Admitting: Family Medicine

## 2021-12-17 ENCOUNTER — Encounter: Payer: Self-pay | Admitting: Family Medicine

## 2021-12-17 ENCOUNTER — Telehealth (INDEPENDENT_AMBULATORY_CARE_PROVIDER_SITE_OTHER): Payer: BC Managed Care – PPO | Admitting: Family Medicine

## 2021-12-17 VITALS — Ht 61.0 in | Wt 208.0 lb

## 2021-12-17 DIAGNOSIS — R221 Localized swelling, mass and lump, neck: Secondary | ICD-10-CM | POA: Diagnosis not present

## 2021-12-17 NOTE — Progress Notes (Signed)
Virtual Visit via Video Note  I connected with Anna Holland  on 12/17/21 at 10:00 AM EDT by a video enabled telemedicine application and verified that I am speaking with the correct person using two identifiers.  Location patient: [place] Location provider:work or home office Persons participating in the virtual visit: patient, provider  I discussed the limitations and requested verbal permission for telemedicine visit. The patient expressed understanding and agreed to proceed.   HPI:  Acute telemedicine visit for lump in neck: -Onset: L submandibular marble sized lump -Symptoms include: mildly uncomfortable, feels firm, she does not think is mobile -Denies:fever, malaise, sinus issues -went to the dentist in July and no issues she is aware of -denies smoking history, tobacco, recent illness, recent injury, gum or teeth issues, recent vaccines -Has tried:nothing -Pertinent past medical history: see below -Pertinent medication allergies:No Known Allergies -COVID-19 vaccine status:  Immunization History  Administered Date(s) Administered   DTaP 08/02/1999, 10/02/1999, 12/10/1999, 09/03/2000, 10/31/2004   HIB (PRP-OMP) 08/02/1999, 10/02/1999, 12/10/1999, 09/03/2000   HIB (PRP-T) 08/02/1999, 10/02/1999, 12/10/1999, 09/03/2000   HPV 9-valent 04/29/2021   Hepatitis A 06/05/2010, 09/02/2017   Hepatitis A, Adult 06/05/2010, 09/02/2017   Hepatitis B 07/02/1999, 03/03/2000, 06/05/2010   Hepatitis B, PED/ADOLESCENT 07/02/1999, 03/03/2000, 06/05/2010   Hpv-Unspecified 06/05/2010, 09/03/2010, 02/13/2015   IPV 08/02/1999, 10/02/1999, 06/04/2000, 10/31/2004   Influenza Inj Mdck Quad Pf 12/27/2018   Influenza,inj,Quad PF,6+ Mos 12/31/2012, 01/25/2017, 01/31/2018, 02/23/2020   Influenza-Unspecified 02/05/2021   MMR 06/04/2000, 10/31/2004   MODERNA COVID-19 SARS-COV-2 PEDS BIVALENT BOOSTER 6Y-11Y 01/16/2021   Meningococcal Conjugate 06/05/2010, 09/02/2017   PFIZER(Purple Top)SARS-COV-2 Vaccination  06/06/2019, 07/04/2019, 02/23/2020   PPD Test 09/13/2019, 09/13/2019   Pneumococcal Conjugate-13 08/02/1999, 10/02/1999, 12/10/1999, 06/08/2001   Tdap 06/05/2010, 06/13/2020   Varicella 06/04/2000, 06/05/2010     ROS: See pertinent positives and negatives per HPI.  Past Medical History:  Diagnosis Date   Allergy    Anxiety    Depression    Heart burn    Knee pain    Lactose intolerance    Plantar fasciitis    Sleep apnea    SOB (shortness of breath)    Vision abnormalities     Past Surgical History:  Procedure Laterality Date   FRACTURE SURGERY     left arm in 2009   LAPAROSCOPIC APPENDECTOMY N/A 12/30/2012   Procedure: APPENDECTOMY LAPAROSCOPIC;  Surgeon: Jerilynn Mages. Gerald Stabs, MD;  Location: [place];  Service: Pediatrics;  Laterality: N/A;     Current Outpatient Medications:    buPROPion (WELLBUTRIN XL) 150 MG 24 hr tablet, Take 1 tablet (150 mg total) by mouth daily., Disp: 30 tablet, Rfl: 3   cetirizine (ZYRTEC) 10 MG tablet, Take 1 tablet (10 mg total) by mouth daily., Disp: 90 tablet, Rfl: 1   citalopram (CELEXA) 20 MG tablet, Take 1 tablet (20 mg total) by mouth daily., Disp: 30 tablet, Rfl: 3   fluticasone (FLONASE) 50 MCG/ACT nasal spray, Place 2 sprays into both nostrils daily., Disp: 16 g, Rfl: 6   Insulin Pen Needle 31G X 5 MM MISC, Use as directed with Saxenda, Disp: 100 each, Rfl: 0   levonorgestrel (KYLEENA) 19.5 MG IUD, 1 each by Intrauterine route once., Disp: , Rfl:    pantoprazole (PROTONIX) 40 MG tablet, Take 1 tablet (40 mg total) by mouth daily., Disp: 90 tablet, Rfl: 1   polyethylene glycol (MIRALAX / GLYCOLAX) 17 g packet, Take 17 g by mouth 2 (two) times daily. Until stooling regularly, Disp: 60 packet, Rfl: 0   Vitamin  D, Ergocalciferol, (DRISDOL) 1.25 MG (50000 UNIT) CAPS capsule, Take 1 capsule (50,000 Units total) by mouth every 7 (seven) days., Disp: 4 capsule, Rfl: 0   Liraglutide -Weight Management (SAXENDA) 18 MG/3ML SOPN, Inject 3 mg into the  skin daily. (Patient not taking: Reported on 12/17/2021), Disp: 15 mL, Rfl: 0   meloxicam (MOBIC) 15 MG tablet, Take 1 tablet (15 mg total) by mouth daily. (Patient not taking: Reported on 12/17/2021), Disp: 90 tablet, Rfl: 1   triamcinolone ointment (KENALOG) 0.1 %, , Disp: , Rfl:   EXAM:  VITALS per patient if applicable:  GENERAL: alert, oriented, appears well and in no acute distress  HEENT: atraumatic, conjunttiva clear, no obvious abnormalities on inspection of external nose and ears  NECK: normal movements of the head and neck, she points to L submand area as region of concern, places her fingers on either side of the lump - appears to be ~ 1cm in size but obviously difficult to assess over video visit. She describes it as firm and fixed.  LUNGS: on inspection no signs of respiratory distress, breathing rate appears normal, no obvious gross SOB, gasping or wheezing  CV: no obvious cyanosis  MS: moves all visible extremities without noticeable abnormality  PSYCH/NEURO: pleasant and cooperative, no obvious depression or anxiety, speech and thought processing grossly intact  ASSESSMENT AND PLAN:  Discussed the following assessment and plan:  Lump in neck  -we discussed possible serious and likely etiologies, options for evaluation and workup, limitations of telemedicine visit vs in person visit, treatment, treatment risks and precautions. Pt is agreeable to treatment via telemedicine at this moment. Query submandibular LAD vs other. Discussed many potential etiologies. She is worried. Advised inperson eval to further assess and she has opted to see ENT. Provided number for ENT office for her to call to schedule. She agrees to do so.  Advise patient to contact PCP for in office appt if does ENT does not assist promptly. Advise to seek prompt virtual visit or in person care if worsening, new symptoms arise, or if is not improving with treatment as expected per our conversation of  expected course. Discussed options for follow up care. Did let this patient know that I do telemedicine on Tuesdays and Thursdays for [place] and those are the days I am logged into the system. Advised to schedule follow up visit with PCP, [place] virtual visits or [facility] if any further questions or concerns to avoid delays in care.   I discussed the assessment and treatment plan with the patient. The patient was provided an opportunity to ask questions and all were answered. The patient agreed with the plan and demonstrated an understanding of the instructions.     Lucretia Kern, DO

## 2021-12-17 NOTE — Patient Instructions (Signed)
Call the Ear, Nose and Throat office today to schedule the appointment.  I hope you are feeling better soon!  Seek in person care promptly if your symptoms worsen, new concerns arise or you are not improving with treatment or if you are not able to get an appointment with the [facility] specialist.  It was nice to meet you today. I help [place] out with telemedicine visits on Tuesdays and Thursdays and am happy to help if you need a virtual follow up visit on those days. Otherwise, if you have any concerns or questions following this visit please schedule a follow up visit with your Primary Care office or seek care at a local urgent care clinic to avoid delays in care. If you are having severe or life threatening symptoms please call 911 and/or go to the nearest emergency room.

## 2021-12-18 ENCOUNTER — Ambulatory Visit (INDEPENDENT_AMBULATORY_CARE_PROVIDER_SITE_OTHER): Payer: BC Managed Care – PPO | Admitting: Nurse Practitioner

## 2021-12-19 ENCOUNTER — Ambulatory Visit (HOSPITAL_COMMUNITY)
Admission: EM | Admit: 2021-12-19 | Discharge: 2021-12-19 | Disposition: A | Discharge status: Home / Self Care | Payer: BC Managed Care – PPO | Attending: Sports Medicine | Admitting: Sports Medicine

## 2021-12-19 ENCOUNTER — Ambulatory Visit (INDEPENDENT_AMBULATORY_CARE_PROVIDER_SITE_OTHER): Payer: BC Managed Care – PPO | Admitting: Nurse Practitioner

## 2021-12-19 ENCOUNTER — Encounter (INDEPENDENT_AMBULATORY_CARE_PROVIDER_SITE_OTHER): Payer: Self-pay | Admitting: Nurse Practitioner

## 2021-12-19 ENCOUNTER — Ambulatory Visit: Payer: BC Managed Care – PPO | Admitting: Family Medicine

## 2021-12-19 VITALS — BP 100/60 | HR 73 | Temp 98.4°F | Ht 61.0 in | Wt 205.0 lb

## 2021-12-19 DIAGNOSIS — E559 Vitamin D deficiency, unspecified: Secondary | ICD-10-CM

## 2021-12-19 DIAGNOSIS — E669 Obesity, unspecified: Secondary | ICD-10-CM | POA: Diagnosis not present

## 2021-12-19 DIAGNOSIS — R591 Generalized enlarged lymph nodes: Secondary | ICD-10-CM | POA: Insufficient documentation

## 2021-12-19 DIAGNOSIS — Z6839 Body mass index (BMI) 39.0-39.9, adult: Secondary | ICD-10-CM

## 2021-12-19 LAB — CBC WITH DIFFERENTIAL/PLATELET
Abs Immature Granulocytes: 0.02 10*3/uL (ref 0.00–0.07)
Basophils Absolute: 0 10*3/uL (ref 0.0–0.1)
Basophils Relative: 0 %
Eosinophils Absolute: 0.1 10*3/uL (ref 0.0–0.5)
Eosinophils Relative: 1 %
HCT: 42 % (ref 36.0–46.0)
Hemoglobin: 13.8 g/dL (ref 12.0–15.0)
Immature Granulocytes: 0 %
Lymphocytes Relative: 30 %
Lymphs Abs: 2.8 10*3/uL (ref 0.7–4.0)
MCH: 27.3 pg (ref 26.0–34.0)
MCHC: 32.9 g/dL (ref 30.0–36.0)
MCV: 83 fL (ref 80.0–100.0)
Monocytes Absolute: 0.4 10*3/uL (ref 0.1–1.0)
Monocytes Relative: 4 %
Neutro Abs: 6.2 10*3/uL (ref 1.7–7.7)
Neutrophils Relative %: 65 %
Platelets: ADEQUATE 10*3/uL (ref 150–400)
RBC: 5.06 MIL/uL (ref 3.87–5.11)
RDW: 13.2 % (ref 11.5–15.5)
WBC: 9.5 10*3/uL (ref 4.0–10.5)
nRBC: 0 % (ref 0.0–0.2)

## 2021-12-19 LAB — COMPREHENSIVE METABOLIC PANEL
ALT: 13 U/L (ref 0–44)
AST: 16 U/L (ref 15–41)
Albumin: 3.6 g/dL (ref 3.5–5.0)
Alkaline Phosphatase: 78 U/L (ref 38–126)
Anion gap: 4 — ABNORMAL LOW (ref 5–15)
BUN: 11 mg/dL (ref 6–20)
CO2: 27 mmol/L (ref 22–32)
Calcium: 9.2 mg/dL (ref 8.9–10.3)
Chloride: 104 mmol/L (ref 98–111)
Creatinine, Ser: 0.61 mg/dL (ref 0.44–1.00)
GFR, Estimated: >60 mL/min (ref >60)
Glucose, Bld: 90 mg/dL (ref 70–99)
Potassium: 3.8 mmol/L (ref 3.5–5.1)
Sodium: 135 mmol/L (ref 135–145)
Total Bilirubin: 0.5 mg/dL (ref 0.3–1.2)
Total Protein: 7.8 g/dL (ref 6.5–8.1)

## 2021-12-19 LAB — HIV ANTIBODY (ROUTINE TESTING W REFLEX): HIV Screen 4th Generation wRfx: NONREACTIVE

## 2021-12-19 NOTE — ED Triage Notes (Signed)
Onset 1-2 week pt reports swollen lymph under her neck. No other symptoms to report.

## 2021-12-19 NOTE — ED Provider Notes (Signed)
[facility]    CSN: [id number] Arrival date & time: 12/19/21  1106      History   Chief Complaint Chief Complaint  Patient presents with   Lymphadenopathy    HPI Anna Holland is a 21 y.o. female.   She presents today with her mother with chief complaint of lymphadenopathy under her left chin.  She reports she noticed this about 2 weeks ago and has drastically increased in size since Sunday.  Home COVID test negative.  She denies any recent illnesses, sick contacts, fevers, chills, nausea, vomiting, diarrhea constipation, headaches or jaw pain.  She had a telehealth visit yesterday and was recommended to see her primary care provider, she contacted her primary care provider who was unable to see her today and recommended urgent care evaluation.  She denies any new sexual partners.  She does endorse unprotected sex with 1 partner only for more than 1 year.  Daily medications include pantoprazole.  She lives at home with her mother father and dog.  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  Leonia Corona, MD;  Location: [facility];  Service: Pediatrics;  Laterality: N/A;    OB History     Gravida  0   Para  0   Term  0   Preterm  0   AB  0   Living  0      SAB  0   IAB  0   Ectopic  0   Multiple  0   Live Births  0            Home Medications    Prior to Admission medications   Medication Sig Start Date End Date Taking? Authorizing Provider  buPROPion (WELLBUTRIN XL) 150 MG 24 hr tablet Take 1 tablet (150 mg total) by mouth daily. 09/23/21   Sheliah Hatch, MD  cetirizine (ZYRTEC) 10 MG tablet Take 1 tablet (10 mg total) by mouth daily. 05/07/21   Sheliah Hatch, MD  citalopram (CELEXA) 20 MG tablet Take 1 tablet (20 mg total) by mouth daily. 09/23/21   Sheliah Hatch, MD  fluticasone (FLONASE) 50 MCG/ACT nasal spray Place 2 sprays into both nostrils daily. 05/07/21   Sheliah Hatch, MD  Insulin Pen Needle 31G X 5 MM MISC Use as directed with Saxenda 10/07/21   Irene Limbo, FNP  levonorgestrel (KYLEENA) 19.5 MG IUD 1 each by Intrauterine route once.  [provider]  Liraglutide -Weight Management (SAXENDA) 18 MG/3ML SOPN Inject 3 mg into the skin daily. Patient not taking: Reported on 12/17/2021 10/07/21   Irene Limbo, FNP  meloxicam (MOBIC) 15 MG tablet Take 1 tablet (15 mg total) by mouth daily. 10/24/21   Sheliah Hatch, MD  pantoprazole (PROTONIX) 40 MG tablet Take 1 tablet (40 mg total) by mouth daily. 05/07/21   Sheliah Hatch, MD  polyethylene glycol (MIRALAX / GLYCOLAX) 17 g packet Take 17 g by mouth 2 (two) times daily. Until stooling regularly 02/11/21   Opalski, Gavin Pound, DO  triamcinolone ointment (KENALOG) 0.1 %     [provider]  Vitamin D, Ergocalciferol, (DRISDOL) 1.25 MG (50000 UNIT) CAPS capsule Take 1 capsule (50,000 Units total) by mouth every 7 (seven) days. 10/31/21   Thomasene Lot, DO    Family History Family History  Problem Relation Age of Onset   Hypertension  Mother    Hyperlipidemia Mother    Depression Mother    Anxiety disorder Mother    Sleep apnea Mother    Obesity Mother    Arthritis Father    Obesity Father    Sleep apnea Father    Depression Brother    Arthritis Maternal Grandmother    Diabetes Maternal Grandmother    Hypertension Maternal Grandmother    Miscarriages / Stillbirths Maternal Grandmother    Stroke Maternal Grandmother    Heart disease Maternal Grandfather    Hypertension Maternal Grandfather    Stroke Maternal Grandfather     Social History Social History   Tobacco Use   Smoking status: Never   Smokeless tobacco: Never  Vaping Use   Vaping Use: Never used  Substance Use Topics   Alcohol use: Never   Drug use: No     Allergies   Patient has no known allergies.   Review of Systems Review of Systems   Physical Exam Triage Vital Signs ED Triage Vitals [12/19/21 1154]  Enc Vitals Group     BP (!) 116/58     Pulse Rate 73     Resp 20     Temp 98.7 F (37.1 C)     Temp Source Oral     SpO2 95 %     Weight      Height      Head Circumference      Peak Flow      Pain Score 0     Pain Loc      Pain Edu?      Excl. in GC?    No data found.  Updated Vital Signs BP (!) 116/58 (BP Location: Left Arm)   Pulse 73   Temp 98.7 F (37.1 C) (Oral)   Resp 20   LMP 12/15/2021   SpO2 95%   Physical Exam Vitals reviewed.  Constitutional:      General: She is not in acute distress.    Appearance: Normal appearance. She is obese. She is not ill-appearing, toxic-appearing or diaphoretic.  HENT:     Head: Normocephalic.     Right Ear: External ear normal.     Left Ear: External ear normal.     Nose: Nose normal. No congestion.     Mouth/Throat:     Mouth: Mucous membranes are moist.     Pharynx: No oropharyngeal exudate or posterior oropharyngeal erythema.  Eyes:     General:        Right eye: No discharge.  Left eye: No discharge.     Conjunctiva/sclera: Conjunctivae normal.   Neck:     Comments: No palpable lymph nodes in the axilla or epitrochlear area of the left extremity Cardiovascular:     Rate and Rhythm: Normal rate.  Pulmonary:     Effort: Pulmonary effort is normal.  Musculoskeletal:     Cervical back: Neck supple.  Lymphadenopathy:     Cervical: Cervical adenopathy (1 cm firm nodule palpated in the submandibular area on the left, 1 small subcentimeter firm nodule palpated midway down on the SCM on the left) present.  Skin:    General: Skin is warm.  Neurological:     Mental Status: She is alert.  Psychiatric:        Mood and Affect: Mood normal.        Behavior: Behavior normal.        Thought Content: Thought content normal.        Judgment: Judgment normal.      UC Treatments / Results  Labs (all labs ordered are listed, but only abnormal results are displayed) Labs Reviewed  CBC WITH DIFFERENTIAL/PLATELET  COMPREHENSIVE METABOLIC PANEL  HIV ANTIBODY (ROUTINE TESTING W REFLEX)    EKG   Radiology No results found.  Procedures Procedures (including critical care time)  Medications Ordered in UC Medications - No data to display  Initial Impression / Assessment and Plan / UC Course  I have reviewed the triage vital signs and the nursing notes.  Pertinent labs & imaging results that were available during my care of the patient were reviewed by me and considered in my medical decision making (see chart for details).     Blood work ordered today CBC with differential, CMP and HIV antibody.  Lymphadenopathy with unknown source.  Urgent care staff will call with abnormal results.  Patient reports negative home COVID test.  I suspect she may have had sort of viral infection unbeknownst to her.  I recommend close follow-up with her primary care provider.  We will hold off on antibiotics at this time as there is no confirmed source of infection.  She does not currently have any red flag signs at this time.  Patient may require further  evaluation with imaging, ultrasound and ENT follow-up.  I advised patient to see her primary care provider in the next 1 to 2 weeks and schedule appointment with ENT.  She may cancel ENT appointment if symptoms resolve prior to appointment.  She and her mother verbalized understanding. Final Clinical Impressions(s) / UC Diagnoses   Final diagnoses:  Lymphadenopathy     Discharge Instructions      You were seen today for lymphadenopathy.  Blood work was ordered today CBC with differential, CMP and HIV testing.  Urgent care staff will call you with abnormal results.  You can also see your results in MyChart.  I highly recommend close follow-up with your primary care provider and ENT.  If symptoms worsen or fail to improve over the next 2 weeks return to urgent care or see your primary care provider.    ED Prescriptions   None    PDMP not reviewed this encounter.   Claudie Leach, DO 12/19/21 1249

## 2021-12-19 NOTE — Discharge Instructions (Addendum)
You were seen today for lymphadenopathy.  Blood work was ordered today CBC with differential, CMP and HIV testing.  Urgent care staff will call you with abnormal results.  You can also see your results in MyChart.  I highly recommend close follow-up with your primary care provider and ENT.  If symptoms worsen or fail to improve over the next 2 weeks return to urgent care or see your primary care provider.

## 2021-12-23 NOTE — Progress Notes (Signed)
Chief Complaint:   OBESITY Anna Holland is here to discuss her progress with her obesity treatment plan along with follow-up of her obesity related diagnoses. Anna Holland is on the Category 2 Plan and states she is following her eating plan approximately 85% of the time. Anna Holland states she is walking/using kettle ball 30/45 minutes 2/3 times per week.  Today's visit was #: 18 Starting weight: 235 lbs Starting date: 12/20/2020 Today's weight: 205 lbs Today's date: 12/19/2021 Total lbs lost to date: 30 lbs Total lbs lost since last in-office visit: 3  Interim History: Anna Holland has done well with weight loss since her last visit. Calories:1100, Protein: 80-139. Some polyphagia and cravings especially at night. Drinking water (not enough), unsweetened tea and coffee. She was unable to start Saxenda due to shortage. Does not want to start at this time. Seeing PCP today for follow up and possible labs.  Subjective:   1. Vitamin D insufficiency Anna Holland is not taking Vit D on a regular basis.  Assessment/Plan:   1. Vitamin D insufficiency Take as directed.  2. Obesity with current BMI of 39.3 Anna Holland is currently in the action stage of change. As such, her goal is to continue with weight loss efforts. She has agreed to the Category 2 Plan.   Exercise goals: As is.  Behavioral modification strategies: increasing lean protein intake, increasing vegetables, and increasing water intake.  Anna Holland has agreed to follow-up with our clinic in 4 weeks. She was informed of the importance of frequent follow-up visits to maximize her success with intensive lifestyle modifications for her multiple health conditions.   Objective:   Blood pressure 100/60, pulse 73, temperature 98.4 F (36.9 C), height 5\' 1"  (1.549 m), weight 205 lb (93 kg), last menstrual period 12/15/2021, SpO2 98 %. Body mass index is 38.73 kg/m.  General: Cooperative, alert, well developed, in no acute distress. HEENT: Conjunctivae and  lids unremarkable. Cardiovascular: Regular rhythm.  Lungs: Normal work of breathing. Neurologic: No focal deficits.   Lab Results  Component Value Date   CREATININE 0.61 12/19/2021   BUN 11 12/19/2021   NA 135 12/19/2021   K 3.8 12/19/2021   CL 104 12/19/2021   CO2 27 12/19/2021   Lab Results  Component Value Date   ALT 13 12/19/2021   AST 16 12/19/2021   ALKPHOS 78 12/19/2021   BILITOT 0.5 12/19/2021   Lab Results  Component Value Date   HGBA1C 5.1 10/31/2021   HGBA1C 5.4 04/17/2021   HGBA1C 5.4 12/20/2020   Lab Results  Component Value Date   INSULIN 11.2 10/31/2021   INSULIN 9.0 04/17/2021   INSULIN 23.5 12/20/2020   Lab Results  Component Value Date   TSH 1.650 12/20/2020   Lab Results  Component Value Date   CHOL 197 10/31/2021   HDL 41 10/31/2021   LDLCALC 140 (H) 10/31/2021   LDLDIRECT 126.0 09/02/2017   TRIG 87 10/31/2021   CHOLHDL 4.8 (H) 10/31/2021   Lab Results  Component Value Date   VD25OH 43.3 10/31/2021   VD25OH 40.0 04/17/2021   VD25OH 26.2 (L) 12/20/2020   Lab Results  Component Value Date   WBC 9.5 12/19/2021   HGB 13.8 12/19/2021   HCT 42.0 12/19/2021   MCV 83.0 12/19/2021   PLT  12/19/2021    PLATELET CLUMPS NOTED ON SMEAR, COUNT APPEARS ADEQUATE   No results found for: "IRON", "TIBC", "FERRITIN"  Attestation Statements:   Reviewed by clinician on day of visit: allergies, medications, problem list, medical  history, surgical history, family history, social history, and previous encounter notes.  Time spent on visit including pre-visit chart review and post-visit care and charting was 30 minutes.   I, Brendell Tyus, RMA, am acting as transcriptionist for Everardo Pacific, FNP.  I have reviewed the above documentation for accuracy and completeness, and I agree with the above. Everardo Pacific, FNP

## 2021-12-26 ENCOUNTER — Encounter: Payer: Self-pay | Admitting: Family Medicine

## 2021-12-26 ENCOUNTER — Ambulatory Visit (INDEPENDENT_AMBULATORY_CARE_PROVIDER_SITE_OTHER): Payer: BC Managed Care – PPO | Admitting: Family Medicine

## 2021-12-26 ENCOUNTER — Ambulatory Visit
Admission: RE | Admit: 2021-12-26 | Discharge: 2021-12-26 | Disposition: A | Discharge status: Home / Self Care | Payer: BC Managed Care – PPO | Source: Ambulatory Visit | Attending: Family Medicine | Admitting: Family Medicine

## 2021-12-26 VITALS — BP 118/78 | HR 74 | Temp 97.9°F | Resp 16 | Ht 61.0 in | Wt 210.4 lb

## 2021-12-26 DIAGNOSIS — R220 Localized swelling, mass and lump, head: Secondary | ICD-10-CM

## 2021-12-26 DIAGNOSIS — Z23 Encounter for immunization: Secondary | ICD-10-CM

## 2021-12-26 NOTE — Patient Instructions (Signed)
Follow up as needed or as scheduled We'll call you to schedule your ultrasound Tylenol or ibuprofen for discomfort Call with any questions or concerns Stay Safe!  Stay Healthy!!

## 2021-12-26 NOTE — Progress Notes (Signed)
   Subjective:    Patient ID: Anna Holland, female    DOB: [birth year], 20 y.o.   MRN: [id number]  HPI LAD- pt went to UC and was evaluated for same.  Had CBC, CMP, and HIV- all normal.  Pt states sxs started ~3 weeks ago.  Feels like lymph nodes are enlarging.  Pt reports tenderness over enlarged area L side of mandible.  No fevers.  No recent cough or congestion.  No rashes.   Review of Systems For ROS see HPI     Objective:   Physical Exam Vitals reviewed.  Constitutional:      General: She is not in acute distress.    Appearance: Normal appearance. She is not ill-appearing.  HENT:     Head: Normocephalic and atraumatic.     Nose: Nose normal. No congestion or rhinorrhea.     Mouth/Throat:     Mouth: Mucous membranes are moist.     Pharynx: Oropharynx is clear. No oropharyngeal exudate or posterior oropharyngeal erythema.  Eyes:     General:        Right eye: No discharge.        Left eye: No discharge.     Extraocular Movements: Extraocular movements intact.     Conjunctiva/sclera: Conjunctivae normal.     Pupils: Pupils are equal, round, and reactive to light.  Cardiovascular:     Rate and Rhythm: Normal rate and regular rhythm.  Pulmonary:     Effort: Pulmonary effort is normal. No respiratory distress.     Breath sounds: No wheezing.  Musculoskeletal:     Cervical back: Normal range of motion and neck supple. Tenderness (over ~2.5cm firm, but mobile L sided submandibular mass) present.  Lymphadenopathy:     Head:     Right side of head: No submental, submandibular, preauricular or posterior auricular adenopathy.     Left side of head: Submandibular adenopathy present. No submental, preauricular or posterior auricular adenopathy.     Cervical: Cervical adenopathy (shotty L SCM LN) present.     Right cervical: No superficial or posterior cervical adenopathy.    Left cervical: Superficial cervical adenopathy present. No posterior cervical adenopathy.     Upper Body:      Right upper body: No supraclavicular, axillary or epitrochlear adenopathy.     Left upper body: No supraclavicular, axillary or epitrochlear adenopathy.  Skin:    General: Skin is warm and dry.     Findings: No rash.  Neurological:     General: No focal deficit present.     Mental Status: She is alert and oriented to person, place, and time.  Psychiatric:        Mood and Affect: Mood normal.        Behavior: Behavior normal.        Thought Content: Thought content normal.           Assessment & Plan:   Submandibular mass- new.  Unclear if this is LAD or other mass.  Pt reports feeling well, recent CBC WNL, and no other LAD.  This makes LAD less likely.  Area is very firm but is mobile.  Discussed possibility of salivary stone.  Will get [place] to better assess.  Pt expressed understanding and is in agreement w/ plan.

## 2021-12-30 ENCOUNTER — Other Ambulatory Visit: Payer: Self-pay

## 2021-12-30 ENCOUNTER — Telehealth: Payer: Self-pay

## 2021-12-30 DIAGNOSIS — R220 Localized swelling, mass and lump, head: Secondary | ICD-10-CM

## 2021-12-30 NOTE — Telephone Encounter (Signed)
Pt seen results Via my chart . CT of neck order has been placed for pt

## 2021-12-30 NOTE — Telephone Encounter (Signed)
-----   Message from Midge Minium, MD sent at 12/29/2021  7:22 PM EDT ----- It looks like you have an enlarged submandibular salivary gland.  To determine if there is infection or a salivary stone, we will get a CT of your neck (with contrast- dx enlarged submandibular gland) to better assess

## 2021-12-30 NOTE — Progress Notes (Signed)
Pt seen results via my chart  

## 2022-01-20 ENCOUNTER — Encounter: Payer: Self-pay | Admitting: Family Medicine

## 2022-01-22 ENCOUNTER — Encounter (INDEPENDENT_AMBULATORY_CARE_PROVIDER_SITE_OTHER): Payer: Self-pay | Admitting: Adult Health

## 2022-01-22 ENCOUNTER — Telehealth (INDEPENDENT_AMBULATORY_CARE_PROVIDER_SITE_OTHER): Payer: BC Managed Care – PPO | Admitting: Adult Health

## 2022-01-22 VITALS — BP 110/76 | HR 85 | Temp 98.4°F | Ht 61.0 in | Wt 207.0 lb

## 2022-01-22 DIAGNOSIS — R220 Localized swelling, mass and lump, head: Secondary | ICD-10-CM | POA: Diagnosis not present

## 2022-01-22 DIAGNOSIS — E669 Obesity, unspecified: Secondary | ICD-10-CM | POA: Diagnosis not present

## 2022-01-22 DIAGNOSIS — Z6839 Body mass index (BMI) 39.0-39.9, adult: Secondary | ICD-10-CM

## 2022-01-22 DIAGNOSIS — E88819 Insulin resistance, unspecified: Secondary | ICD-10-CM | POA: Diagnosis not present

## 2022-01-22 DIAGNOSIS — E559 Vitamin D deficiency, unspecified: Secondary | ICD-10-CM

## 2022-01-22 MED ORDER — VITAMIN D (ERGOCALCIFEROL) 1.25 MG (50000 UNIT) PO CAPS: 50000.0000 [IU] | ORAL_CAPSULE | ORAL | 0 refills | Status: DC

## 2022-01-23 ENCOUNTER — Ambulatory Visit
Admission: RE | Admit: 2022-01-23 | Discharge: 2022-01-23 | Disposition: A | Discharge status: Home / Self Care | Payer: BC Managed Care – PPO | Source: Ambulatory Visit | Attending: Family Medicine | Admitting: Family Medicine

## 2022-01-23 DIAGNOSIS — R220 Localized swelling, mass and lump, head: Secondary | ICD-10-CM

## 2022-01-23 MED ORDER — IOPAMIDOL (ISOVUE-300) INJECTION 61%
75.0000 mL | Freq: Once | INTRAVENOUS | Status: AC | PRN
  Administered 2022-01-23: 75 mL via INTRAVENOUS

## 2022-01-27 ENCOUNTER — Other Ambulatory Visit: Payer: Self-pay | Admitting: Family Medicine

## 2022-01-27 DIAGNOSIS — R220 Localized swelling, mass and lump, head: Secondary | ICD-10-CM

## 2022-01-27 DIAGNOSIS — R591 Generalized enlarged lymph nodes: Secondary | ICD-10-CM

## 2022-01-27 NOTE — Progress Notes (Signed)
Pt seen results Via my chart and referral to ENT has been placed

## 2022-01-29 NOTE — Progress Notes (Unsigned)
Chief Complaint:   OBESITY Anna Holland is here to discuss her progress with her obesity treatment plan along with follow-up of her obesity related diagnoses. Anna Holland is on the Category 2 Plan and states she is following her eating plan approximately 80% of the time. Anna Holland states she is walking  20-30 minutes 4 times per week.  Today's visit was #: 19 Starting weight: 235 lbs Starting date: 12/20/2020 Today's weight: 207 lbs Today's date: 01/22/2022 Total lbs lost to date: 28 lbs Total lbs lost since last in-office visit: +2 lbs  Interim History: She is in [employer], graduated May 2023 (ED).  GLP-1 history:  tolerated Wegovy, unable to obtain Saxenda, due to national drug shortage.  She feels that journaling is the best eating plan for her. Her average calories 1200, her average protein 70-90 grams.   Subjective:   1. Vitamin D insufficiency 10/31/2021, Vitamin D level 43.3.  2. Insulin resistance 1st [facility] 07/17/2021, then has been unable to obtain due to national drug shortage.  Never *** past Wegovy 0.25 mg.   3. Mass of submandibular region 12/26/2021 ultrasound ***  Assessment/Plan:   1. Vitamin D insufficiency Check labs at next office visit.   Refill - Vitamin D, Ergocalciferol, (DRISDOL) 1.25 MG (50000 UNIT) CAPS capsule; Take 1 capsule (50,000 Units total) by mouth every 7 (seven) days.  Dispense: 4 capsule; Refill: 0  2. Insulin resistance Check fasting labs at next office visit.   3. Mass of submandibular region Complete ordered CT.   4. Obesity with current BMI of 39.3 Check fasting labs at next office visit.  Re-attempt Wegovy therapy late fall, early winter.  Anna Holland is currently in the action stage of change. As such, her goal is to continue with weight loss efforts. She has agreed to keeping a food journal and adhering to recommended goals of 100 calories and 90 protein daily.   Exercise goals:  As is.   Behavioral modification strategies:  increasing lean protein intake, decreasing simple carbohydrates, meal planning and cooking strategies, keeping healthy foods in the home, and planning for success.  Anna Holland has agreed to follow-up with our clinic in 3-4 weeks. She was informed of the importance of frequent follow-up visits to maximize her success with intensive lifestyle modifications for her multiple health conditions.   Objective:   Blood pressure 110/76, pulse 85, temperature 98.4 F (36.9 C), height 5\' 1"  (1.549 m), weight 207 lb (93.9 kg), SpO2 98 %. Body mass index is 39.11 kg/m.  General: Cooperative, alert, well developed, in no acute distress. HEENT: Conjunctivae and lids unremarkable. Cardiovascular: Regular rhythm.  Lungs: Normal work of breathing. Neurologic: No focal deficits.   Lab Results  Component Value Date   CREATININE 0.61 12/19/2021   BUN 11 12/19/2021   NA 135 12/19/2021   K 3.8 12/19/2021   CL 104 12/19/2021   CO2 27 12/19/2021   Lab Results  Component Value Date   ALT 13 12/19/2021   AST 16 12/19/2021   ALKPHOS 78 12/19/2021   BILITOT 0.5 12/19/2021   Lab Results  Component Value Date   HGBA1C 5.1 10/31/2021   HGBA1C 5.4 04/17/2021   HGBA1C 5.4 12/20/2020   Lab Results  Component Value Date   INSULIN 11.2 10/31/2021   INSULIN 9.0 04/17/2021   INSULIN 23.5 12/20/2020   Lab Results  Component Value Date   TSH 1.650 12/20/2020   Lab Results  Component Value Date   CHOL 197 10/31/2021   HDL 41  10/31/2021   LDLCALC 140 (H) 10/31/2021   LDLDIRECT 126.0 09/02/2017   TRIG 87 10/31/2021   CHOLHDL 4.8 (H) 10/31/2021   Lab Results  Component Value Date   VD25OH 43.3 10/31/2021   VD25OH 40.0 04/17/2021   VD25OH 26.2 (L) 12/20/2020   Lab Results  Component Value Date   WBC 9.5 12/19/2021   HGB 13.8 12/19/2021   HCT 42.0 12/19/2021   MCV 83.0 12/19/2021   PLT  12/19/2021    PLATELET CLUMPS NOTED ON SMEAR, COUNT APPEARS ADEQUATE   No results found for: "IRON", "TIBC",  "FERRITIN"  Attestation Statements:   Reviewed by clinician on day of visit: allergies, medications, problem list, medical history, surgical history, family history, social history, and previous encounter notes.  I, Davy Pique, RMA, am acting as Location manager for Mina Marble, NP.  I have reviewed the above documentation for accuracy and completeness, and I agree with the above. -  ***

## 2022-02-20 ENCOUNTER — Encounter (INDEPENDENT_AMBULATORY_CARE_PROVIDER_SITE_OTHER): Payer: Self-pay | Admitting: Family Medicine

## 2022-02-20 ENCOUNTER — Ambulatory Visit (INDEPENDENT_AMBULATORY_CARE_PROVIDER_SITE_OTHER): Payer: BC Managed Care – PPO | Admitting: Family Medicine

## 2022-02-20 VITALS — BP 100/65 | HR 66 | Temp 98.2°F | Ht 61.0 in | Wt 203.6 lb

## 2022-02-20 DIAGNOSIS — E669 Obesity, unspecified: Secondary | ICD-10-CM | POA: Diagnosis not present

## 2022-02-20 DIAGNOSIS — E88819 Insulin resistance, unspecified: Secondary | ICD-10-CM | POA: Diagnosis not present

## 2022-02-20 DIAGNOSIS — R5383 Other fatigue: Secondary | ICD-10-CM | POA: Insufficient documentation

## 2022-02-20 DIAGNOSIS — Z6838 Body mass index (BMI) 38.0-38.9, adult: Secondary | ICD-10-CM | POA: Diagnosis not present

## 2022-02-20 DIAGNOSIS — E559 Vitamin D deficiency, unspecified: Secondary | ICD-10-CM | POA: Diagnosis not present

## 2022-02-20 MED ORDER — VITAMIN D (ERGOCALCIFEROL) 1.25 MG (50000 UNIT) PO CAPS: 50000.0000 [IU] | ORAL_CAPSULE | ORAL | 0 refills | Status: DC

## 2022-02-22 LAB — INSULIN, RANDOM: INSULIN: 8.3 u[IU]/mL (ref 2.6–24.9)

## 2022-02-22 LAB — HEMOGLOBIN A1C
Est. average glucose Bld gHb Est-mCnc: 103 mg/dL
Hgb A1c MFr Bld: 5.2 % (ref 4.8–5.6)

## 2022-02-22 LAB — VITAMIN D 25 HYDROXY (VIT D DEFICIENCY, FRACTURES): Vit D, 25-Hydroxy: 64 ng/mL (ref 30.0–100.0)

## 2022-02-22 LAB — TSH: TSH: 1.1 u[IU]/mL (ref 0.450–4.500)

## 2022-02-22 LAB — T4, FREE: Free T4: 1.19 ng/dL (ref 0.82–1.77)

## 2022-03-05 NOTE — Progress Notes (Signed)
Chief Complaint:   OBESITY Anna Holland is here to discuss her progress with her obesity treatment plan along with follow-up of her obesity related diagnoses. Anna Holland is on keeping a food journal and adhering to recommended goals of 1000-1100 calories and 90+ protein and states she is following her eating plan approximately 80-85% of the time. Anna Holland states she is not exercising.   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  Gabrielly Mccrystal is here for a follow up office visit.  We reviewed her meal plan and all questions were answered.  Patient's food recall appears to be accurate and consistent with what is on plan when she is following it.   When eating on plan, her hunger and cravings are well controlled.     Subjective:   1. Insulin resistance She denies concerns with hunger or cravings. She is not taking any medications.   2. Vitamin D deficiency She is currently taking prescription vitamin D 50,000 IU each week. She denies nausea, vomiting or muscle weakness.  Assessment/Plan:   Orders Placed This Encounter  Procedures   Hemoglobin A1c   Insulin, random   T4, free   TSH   VITAMIN D 25 Hydroxy (Vit-D Deficiency, Fractures)    Medications Discontinued During This Encounter  Medication Reason   Vitamin D, Ergocalciferol, (DRISDOL) 1.25 MG (50000 UNIT) CAPS capsule Reorder     Meds ordered this encounter  Medications   Vitamin D, Ergocalciferol, (DRISDOL) 1.25 MG (50000 UNIT) CAPS capsule    Sig: Take 1 capsule (50,000 Units total) by mouth every 7 (seven) days.    Dispense:  4 capsule    Refill:  0     1. Insulin resistance Check labs today.   - Hemoglobin A1c - Insulin, random - T4, free - TSH  2. Vitamin D deficiency Check  labs today.   Refill - Vitamin D, Ergocalciferol, (DRISDOL) 1.25 MG (50000 UNIT) CAPS capsule; Take 1 capsule (50,000 Units total) by mouth every 7 (seven) days.  Dispense: 4 capsule; Refill: 0  - VITAMIN D 25 Hydroxy (Vit-D Deficiency, Fractures)  3. Obesity with current BMI of 38.5 Strategies discussed with patient and handout given.,   Anna Holland is currently in the action stage of change. As such, her goal is to continue with weight loss efforts. She has agreed to keeping a food journal and adhering to recommended goals of 1000-1100 calories and 90+ protein daily.   Exercise goals:  As is.   Behavioral modification strategies: increasing lean protein intake, decreasing simple carbohydrates, holiday eating strategies , and celebration eating strategies.  Anna Holland has agreed to follow-up with our clinic in 4 weeks. She was informed of the importance of frequent follow-up visits to maximize her success with intensive lifestyle modifications for her multiple health conditions.   Anna Holland was informed we would discuss her lab results at her next visit unless there is a critical issue that needs to be addressed sooner. Anna Holland agreed to keep her next visit at the agreed upon time to discuss these results.  Objective:   Blood pressure 100/65, pulse 66, temperature 98.2 F (36.8 C), height 5\' 1"  (1.549 m), weight 203 lb 9.6 oz (92.4 kg), SpO2 100 %. Body mass index is 38.47 kg/m.  General: Cooperative, alert, well developed, in no acute distress. HEENT: Conjunctivae and lids unremarkable. Cardiovascular: Regular rhythm.  Lungs: Normal work of breathing. Neurologic: No focal deficits.   Lab Results  Component Value Date   CREATININE 0.61 12/19/2021   BUN 11 12/19/2021   NA 135 12/19/2021   K 3.8 12/19/2021   CL 104 12/19/2021   CO2 27 12/19/2021   Lab Results  Component Value Date   ALT 13 12/19/2021   AST 16 12/19/2021   ALKPHOS 78 12/19/2021   BILITOT 0.5 12/19/2021   Lab Results   Component Value Date   HGBA1C 5.2 02/20/2022   HGBA1C 5.1 10/31/2021   HGBA1C 5.4 04/17/2021   HGBA1C 5.4 12/20/2020   Lab Results  Component Value Date   INSULIN 8.3 02/20/2022   INSULIN 11.2 10/31/2021   INSULIN 9.0 04/17/2021   INSULIN 23.5 12/20/2020   Lab Results  Component Value Date   TSH 1.100 02/20/2022   Lab Results  Component Value Date   CHOL 197 10/31/2021   HDL 41 10/31/2021   LDLCALC 140 (H) 10/31/2021   LDLDIRECT 126.0 09/02/2017   TRIG 87 10/31/2021   CHOLHDL 4.8 (H) 10/31/2021   Lab Results  Component Value Date   VD25OH 64.0 02/20/2022   VD25OH 43.3 10/31/2021   VD25OH 40.0 04/17/2021   Lab Results  Component Value Date   WBC 9.5 12/19/2021   HGB 13.8 12/19/2021   HCT 42.0 12/19/2021   MCV 83.0 12/19/2021   PLT  12/19/2021    PLATELET CLUMPS NOTED ON SMEAR, COUNT APPEARS ADEQUATE   No results found for: "IRON", "TIBC", "FERRITIN"  Attestation Statements:   Reviewed by clinician on day of visit: allergies, medications, problem list, medical history, surgical history, family history, social history, and previous encounter notes.  I, Malcolm Metro, RMA, am acting as Energy manager for [employer], DO.  I have reviewed the above documentation for accuracy and completeness, and I agree with the above. Carlye Grippe, D.O.  The 21st Century Cures Act was signed into law in 2016 which includes the topic of electronic health records.  This provides immediate access to information in MyChart.  This includes consultation notes, operative notes, office notes, lab results and pathology reports.  If you have any questions about what you read please let us know at your next visit so we can discuss your concerns and take corrective action if need be.  We are right here with you.

## 2022-03-09 ENCOUNTER — Other Ambulatory Visit: Payer: Self-pay

## 2022-03-09 ENCOUNTER — Encounter (HOSPITAL_BASED_OUTPATIENT_CLINIC_OR_DEPARTMENT_OTHER): Payer: Self-pay | Admitting: Emergency Medicine

## 2022-03-09 DIAGNOSIS — Z20822 Contact with and (suspected) exposure to covid-19: Secondary | ICD-10-CM | POA: Insufficient documentation

## 2022-03-09 DIAGNOSIS — G43809 Other migraine, not intractable, without status migrainosus: Secondary | ICD-10-CM | POA: Diagnosis not present

## 2022-03-09 DIAGNOSIS — R519 Headache, unspecified: Secondary | ICD-10-CM | POA: Diagnosis present

## 2022-03-09 LAB — RESP PANEL BY RT-PCR (FLU A&B, COVID) ARPGX2
Influenza A by PCR: NEGATIVE
Influenza B by PCR: NEGATIVE
SARS Coronavirus 2 by RT PCR: NEGATIVE

## 2022-03-09 NOTE — ED Triage Notes (Signed)
Pt here with c/o headache along with some n/v lighst does bother her eyes , tried some Excedrin this am with minimal relief

## 2022-03-10 ENCOUNTER — Emergency Department (HOSPITAL_BASED_OUTPATIENT_CLINIC_OR_DEPARTMENT_OTHER): Payer: BC Managed Care – PPO

## 2022-03-10 ENCOUNTER — Telehealth: Payer: Self-pay | Admitting: Family Medicine

## 2022-03-10 ENCOUNTER — Emergency Department (HOSPITAL_BASED_OUTPATIENT_CLINIC_OR_DEPARTMENT_OTHER)
Admission: EM | Admit: 2022-03-10 | Discharge: 2022-03-10 | Disposition: A | Discharge status: Home / Self Care | Payer: BC Managed Care – PPO | Attending: Emergency Medicine | Admitting: Emergency Medicine

## 2022-03-10 DIAGNOSIS — G43809 Other migraine, not intractable, without status migrainosus: Secondary | ICD-10-CM

## 2022-03-10 LAB — BASIC METABOLIC PANEL
Anion gap: 9 (ref 5–15)
BUN: 8 mg/dL (ref 6–20)
CO2: 26 mmol/L (ref 22–32)
Calcium: 9.5 mg/dL (ref 8.9–10.3)
Chloride: 100 mmol/L (ref 98–111)
Creatinine, Ser: 0.54 mg/dL (ref 0.44–1.00)
GFR, Estimated: >60 mL/min (ref >60)
Glucose, Bld: 117 mg/dL — ABNORMAL HIGH (ref 70–99)
Potassium: 3.4 mmol/L — ABNORMAL LOW (ref 3.5–5.1)
Sodium: 135 mmol/L (ref 135–145)

## 2022-03-10 LAB — CBC WITH DIFFERENTIAL/PLATELET
Abs Immature Granulocytes: 0.04 10*3/uL (ref 0.00–0.07)
Basophils Absolute: 0 10*3/uL (ref 0.0–0.1)
Basophils Relative: 0 %
Eosinophils Absolute: 0 10*3/uL (ref 0.0–0.5)
Eosinophils Relative: 0 %
HCT: 37.1 % (ref 36.0–46.0)
Hemoglobin: 12.3 g/dL (ref 12.0–15.0)
Immature Granulocytes: 0 %
Lymphocytes Relative: 11 %
Lymphs Abs: 1.4 10*3/uL (ref 0.7–4.0)
MCH: 27 pg (ref 26.0–34.0)
MCHC: 33.2 g/dL (ref 30.0–36.0)
MCV: 81.5 fL (ref 80.0–100.0)
Monocytes Absolute: 0.4 10*3/uL (ref 0.1–1.0)
Monocytes Relative: 3 %
Neutro Abs: 11.5 10*3/uL — ABNORMAL HIGH (ref 1.7–7.7)
Neutrophils Relative %: 86 %
Platelets: 344 10*3/uL (ref 150–400)
RBC: 4.55 MIL/uL (ref 3.87–5.11)
RDW: 13 % (ref 11.5–15.5)
WBC: 13.4 10*3/uL — ABNORMAL HIGH (ref 4.0–10.5)
nRBC: 0 % (ref 0.0–0.2)

## 2022-03-10 MED ORDER — PROCHLORPERAZINE EDISYLATE 10 MG/2ML IJ SOLN
10.0000 mg | Freq: Once | INTRAMUSCULAR | Status: AC
  Administered 2022-03-10: 10 mg via INTRAVENOUS
  Filled 2022-03-10: qty 2

## 2022-03-10 MED ORDER — SODIUM CHLORIDE 0.9 % IV BOLUS
1000.0000 mL | Freq: Once | INTRAVENOUS | Status: AC
  Administered 2022-03-10: 1000 mL via INTRAVENOUS

## 2022-03-10 MED ORDER — KETOROLAC TROMETHAMINE 30 MG/ML IJ SOLN
30.0000 mg | Freq: Once | INTRAMUSCULAR | Status: AC
  Administered 2022-03-10: 30 mg via INTRAVENOUS
  Filled 2022-03-10: qty 1

## 2022-03-10 MED ORDER — ONDANSETRON 4 MG PO TBDP: 4.0000 mg | ORAL_TABLET | Freq: Three times a day (TID) | ORAL | 0 refills | Status: DC | PRN

## 2022-03-10 NOTE — ED Provider Notes (Signed)
[facility] EMERGENCY DEPT  Provider Note  CSN: [id number] Arrival date & time: 03/09/22 2002  History Chief Complaint  Patient presents with   Migraine    Anna Holland is a 21 y.o. female with no prior diagnosis of migraines reports 3 days of severe throbbing L sided headache, started in the back, now diffuse, associated with photophobia and nausea. Had some relief with naprosyn yesterday, but today has been vomiting and unable to keep anything down. Has never had similar headache before. No fever, cough, congestion or blurry vision.    Home Medications Prior to Admission medications   Medication Sig Start Date End Date Taking? Authorizing Provider  ondansetron (ZOFRAN-ODT) 4 MG disintegrating tablet Take 1 tablet (4 mg total) by mouth every 8 (eight) hours as needed for nausea or vomiting. 03/10/22  Yes Pollyann Savoy, MD  buPROPion (WELLBUTRIN XL) 150 MG 24 hr tablet Take 1 tablet (150 mg total) by mouth daily. 09/23/21   Sheliah Hatch, MD  cetirizine (ZYRTEC) 10 MG tablet Take 1 tablet (10 mg total) by mouth daily. 05/07/21   Sheliah Hatch, MD  citalopram (CELEXA) 20 MG tablet Take 1 tablet (20 mg total) by mouth daily. 09/23/21   Sheliah Hatch, MD  fluticasone (FLONASE) 50 MCG/ACT nasal spray Place 2 sprays into both nostrils daily. 05/07/21   Sheliah Hatch, MD  Insulin Pen Needle 31G X 5 MM MISC Use as directed with Saxenda 10/07/21   Irene Limbo, FNP  levonorgestrel (KYLEENA) 19.5 MG IUD 1 each by Intrauterine route once.    [provider]  Liraglutide -Weight Management (SAXENDA) 18 MG/3ML SOPN Inject 3 mg into the skin daily. 10/07/21   Irene Limbo, FNP  meloxicam (MOBIC) 15 MG tablet Take 1 tablet (15 mg total) by mouth daily. 10/24/21   Sheliah Hatch, MD  pantoprazole (PROTONIX) 40 MG tablet Take 1 tablet (40 mg total) by mouth daily. 05/07/21   Sheliah Hatch, MD  polyethylene glycol (MIRALAX / GLYCOLAX)  17 g packet Take 17 g by mouth 2 (two) times daily. Until stooling regularly 02/11/21   Opalski, Gavin Pound, DO  triamcinolone ointment (KENALOG) 0.1 %     [provider]  Vitamin D, Ergocalciferol, (DRISDOL) 1.25 MG (50000 UNIT) CAPS capsule Take 1 capsule (50,000 Units total) by mouth every 7 (seven) days. 02/20/22   Thomasene Lot, DO     Allergies    Patient has no known allergies.   Review of Systems   Review of Systems Please see HPI for pertinent positives and negatives  Physical Exam BP (!) 108/42   Pulse 96   Temp 99.6 F (37.6 C) (Oral)   Resp 18   LMP 03/09/2022 (Exact Date)   SpO2 99%   Physical Exam Vitals and nursing note reviewed.  Constitutional:      Appearance: Normal appearance.  HENT:     Head: Normocephalic and atraumatic.     Nose: Nose normal.     Mouth/Throat:     Mouth: Mucous membranes are moist.  Eyes:     Extraocular Movements: Extraocular movements intact.     Conjunctiva/sclera: Conjunctivae normal.  Cardiovascular:     Rate and Rhythm: Normal rate.  Pulmonary:     Effort: Pulmonary effort is normal.     Breath sounds: Normal breath sounds.  Abdominal:     General: Abdomen is flat.     Palpations: Abdomen is soft.     Tenderness: There is no abdominal tenderness.  Musculoskeletal:        General: No swelling. Normal range of motion.     Cervical back: Neck supple.  Skin:    General: Skin is warm and dry.  Neurological:     General: No focal deficit present.     Mental Status: She is alert and oriented to person, place, and time.     Cranial Nerves: No cranial nerve deficit.     Sensory: No sensory deficit.     Motor: No weakness.  Psychiatric:        Mood and Affect: Mood normal.     ED Results / Procedures / Treatments   EKG None  Procedures Procedures  Medications Ordered in the ED Medications  sodium chloride 0.9 % bolus 1,000 mL (1,000 mLs Intravenous New Bag/Given 03/10/22 0050)  ketorolac (TORADOL) 30  MG/ML injection 30 mg (30 mg Intravenous Given 03/10/22 0050)  prochlorperazine (COMPAZINE) injection 10 mg (10 mg Intravenous Given 03/10/22 0050)    Initial Impression and Plan  Patient here with new headache, sounds migrainous but no history of similar. Will check basic labs, send for head CT and give migraine cocktail with IVF for comfort.   ED Course   Clinical Course as of 03/10/22 0143  Mon Mar 10, 2022  0045 Covid/Flu are neg.  [CS]  0105 CBC with mild leukocytosis.  [CS]  0125 BMP is unremarkable. I personally viewed the images from radiology studies and agree with radiologist interpretation: CT is neg.  [CS]  0141 Patient feeling better, tolerating PO fluids and ready to go home. Recommend OTC migraine meds as needed. Zofran for nausea. PCP follow up.  [CS]    Clinical Course User Index [CS] Pollyann Savoy, MD     MDM Rules/Calculators/A&P Medical Decision Making Problems Addressed: Other migraine without status migrainosus, not intractable: acute illness or injury  Amount and/or Complexity of Data Reviewed Labs: ordered. Decision-making details documented in ED Course. Radiology: ordered and independent interpretation performed. Decision-making details documented in ED Course.  Risk Prescription drug management.    Final Clinical Impression(s) / ED Diagnoses Final diagnoses:  Other migraine without status migrainosus, not intractable    Rx / DC Orders ED Discharge Orders          Ordered    ondansetron (ZOFRAN-ODT) 4 MG disintegrating tablet  Every 8 hours PRN        03/10/22 0142             Pollyann Savoy, MD 03/10/22 [id number]

## 2022-03-10 NOTE — Telephone Encounter (Signed)
Pt 's mom states they are going to contact the ED for additional information.

## 2022-03-10 NOTE — Telephone Encounter (Signed)
Must schedule appt. Advice will not be given without seeing the patient

## 2022-03-10 NOTE — Telephone Encounter (Signed)
Pt refused to schedule appt at this time. Pt was seen in the ED last night for migraines. She has a question regarding medication interactions and if she is allowed to take an over the counter medication for migraines along with additional medication. She is asking for a call back from the nurse.

## 2022-03-17 ENCOUNTER — Encounter: Payer: Self-pay | Admitting: Family Medicine

## 2022-03-17 ENCOUNTER — Ambulatory Visit (INDEPENDENT_AMBULATORY_CARE_PROVIDER_SITE_OTHER): Payer: BC Managed Care – PPO | Admitting: Family Medicine

## 2022-03-17 VITALS — BP 124/80 | HR 79 | Temp 97.7°F | Resp 16 | Ht 61.0 in | Wt 209.1 lb

## 2022-03-17 DIAGNOSIS — G43909 Migraine, unspecified, not intractable, without status migrainosus: Secondary | ICD-10-CM | POA: Diagnosis not present

## 2022-03-17 DIAGNOSIS — M436 Torticollis: Secondary | ICD-10-CM | POA: Diagnosis not present

## 2022-03-17 MED ORDER — PROMETHAZINE HCL 25 MG PO TABS: 25.0000 mg | ORAL_TABLET | Freq: Three times a day (TID) | ORAL | 6 refills | Status: AC | PRN

## 2022-03-17 MED ORDER — SUMATRIPTAN SUCCINATE 50 MG PO TABS: 50.0000 mg | ORAL_TABLET | Freq: Once | ORAL | 6 refills | Status: AC

## 2022-03-17 NOTE — Progress Notes (Unsigned)
   Subjective:    Patient ID: Anna Holland, female    DOB: [birth year], 21 y.o.   MRN: [id number]  HPI ER f/u- pt went to ER on 12/4 w/ headache and vomiting.  Dx'd w/ migraine.  No hx of migraine.  Had normal CT scan.  Sxs improved w/ IVF and migraine cocktail.  Was d/c'd w/ Zofran (insurance didn't pay) and instructions to f/u.  Pt reports ongoing stiff neck and occipital pain.  + photo and phonophobia.  Did not have any visual changes preceding HA- woke w/ HA.  Pt reports Excedrin migraine only provided 2 hrs of relief.  No longer having nausea.  Continues to have photosensitivity but phonophobia has improved.  Pt initially had muscle aches but this has improved.  Pt was having her period when sxs started.     Review of Systems For ROS see HPI     Objective:   Physical Exam Vitals reviewed.  Constitutional:      General: She is not in acute distress.    Appearance: Normal appearance. She is well-developed. She is not ill-appearing.  HENT:     Head: Normocephalic and atraumatic.     Right Ear: Tympanic membrane and ear canal normal.     Left Ear: Tympanic membrane and ear canal normal.  Eyes:     Conjunctiva/sclera: Conjunctivae normal.     Pupils: Pupils are equal, round, and reactive to light.  Cardiovascular:     Rate and Rhythm: Normal rate and regular rhythm.     Heart sounds: Normal heart sounds.  Pulmonary:     Effort: Pulmonary effort is normal. No respiratory distress.     Breath sounds: Normal breath sounds. No wheezing or rales.  Musculoskeletal:     Cervical back: Normal range of motion and neck supple. Tenderness (TTP over bilateral upper trap spasms) present.  Lymphadenopathy:     Cervical: No cervical adenopathy.  Skin:    General: Skin is warm and dry.  Neurological:     General: No focal deficit present.     Mental Status: She is alert and oriented to person, place, and time.     Cranial Nerves: No cranial nerve deficit.     Coordination: Coordination  normal.     Deep Tendon Reflexes: Reflexes are normal and symmetric.  Psychiatric:        Behavior: Behavior normal.        Thought Content: Thought content normal.        Judgment: Judgment normal.           Assessment & Plan:  Migraine- new.  Pt reports she was having her period when she developed her sxs.  She had classic photophobia, phonophobia, nausea, and pain.  Sxs improved w/ migraine cocktail and fluids in ER.  Thankfully CT scan was WNL.  Discussed migraines, their relationship to menstrual cycles and weather fronts, as well as other triggers.  Discussed taking OTC Migraine medication at first sign of headache.  If no improvement after 20-30 minutes, she will take Imitrex.  She can use Promethazine prn.  Encouraged her to log any headaches so we can determine if there is a trigger or pattern.  Pt expressed understanding and is in agreement w/ plan.   Neck Stiffness- new.  Pt to take the Methocarbamol she has at home.  She will evaluate her pillow and her sleeping position.  Encouraged heating pad.  Will follow.

## 2022-03-17 NOTE — Patient Instructions (Signed)
Follow up as needed or as scheduled Make sure you are drinking LOTS of water.  Dehydration can trigger headaches At the first sign of a headache, take 2 Excedrin Migraine (or store equivalent).  If no improvement in 20-30 minutes, take the Sumatriptan (rescue medication) Take the Promethazine as needed for nausea Try and keep a headache log to see when they occur, how long they last, any possible triggers, etc Restart the Methocarbamol at night to help w/ neck pain/stiffness Call with any questions or concerns Hang in there! Happy Holidays!!

## 2022-03-20 ENCOUNTER — Ambulatory Visit (INDEPENDENT_AMBULATORY_CARE_PROVIDER_SITE_OTHER): Payer: BC Managed Care – PPO | Admitting: Physician Assistant

## 2022-03-20 ENCOUNTER — Encounter (INDEPENDENT_AMBULATORY_CARE_PROVIDER_SITE_OTHER): Payer: Self-pay | Admitting: Physician Assistant

## 2022-03-20 VITALS — BP 113/64 | HR 66 | Temp 98.5°F | Ht 61.0 in | Wt 201.0 lb

## 2022-03-20 DIAGNOSIS — E559 Vitamin D deficiency, unspecified: Secondary | ICD-10-CM | POA: Diagnosis not present

## 2022-03-20 DIAGNOSIS — G43909 Migraine, unspecified, not intractable, without status migrainosus: Secondary | ICD-10-CM | POA: Diagnosis not present

## 2022-03-20 DIAGNOSIS — E669 Obesity, unspecified: Secondary | ICD-10-CM

## 2022-03-20 DIAGNOSIS — Z6838 Body mass index (BMI) 38.0-38.9, adult: Secondary | ICD-10-CM | POA: Diagnosis not present

## 2022-03-20 MED ORDER — VITAMIN D (ERGOCALCIFEROL) 1.25 MG (50000 UNIT) PO CAPS: 50000.0000 [IU] | ORAL_CAPSULE | ORAL | 0 refills | Status: DC

## 2022-04-03 NOTE — Progress Notes (Signed)
Chief Complaint:   OBESITY Anna Holland is here to discuss her progress with her obesity treatment plan along with follow-up of her obesity related diagnoses. Anna Holland is on keeping a food journal and adhering to recommended goals of 1000-1100 calories and 90+ grams of protein and states she is following her eating plan approximately 75% of the time. Anna Holland states she is walking 30 minutes 3-4 times per week.  Today's visit was #: 21 Starting weight: 235 lbs Starting date: 12/20/2020 Today's weight: 201 lbs Today's date: 03/20/2022 Total lbs lost to date: 34 lbs Total lbs lost since last in-office visit: 2  Interim History: Anna Holland has done well with weight loss.  She is a Water quality scientist and working. She is trying to work toward meeting protein goals and supplements with [employer] protein shakes for lunch at times due to her busy schedule.   She had difficult time recently with new diagnosis of migraine. New migraine was felt to be related to hormones (IUD nearing end date) and working with PCP to address this and she is doing much better now .  Subjective:   1. Vitamin D deficiency Anna Holland is currently taking prescription Vit D 50,000 IU once a week. Denies any side effects. Last Vit D level of 64.0--at goal on 02/20/22.  2. Migraine without status migrainosus, not intractable, unspecified migraine type Anna Holland went to the ED on 03/10/2022 with first ever migraine.  Saw PCP for follow up and felt to possibly be due to hormonal with IUD at near end day.  Doing well now-no recurrence of headache.   Assessment/Plan:   1. Vitamin D deficiency Continue/Refill Vit D 50,000 IU once every other week for 1 month with 0 refills. Will check labs in 2-3 months.  -Refill Vitamin D, Ergocalciferol, (DRISDOL) 1.25 MG (50000 UNIT) CAPS capsule; Take 1 capsule (50,000 Units total) by mouth every 14 (fourteen) days.  Dispense: 4 capsule; Refill: 0  2. Migraine without status migrainosus, not  intractable, unspecified migraine type Continue as needed migraine meds (Excedrin).  Maintain hydration and follow-up with PCP as directed.  3. Obesity with current BMI of 38.0 Anna Holland is currently in the action stage of change. As such, her goal is to continue with weight loss efforts. She has agreed to keeping a food journal and adhering to recommended goals of 1000-1100 calories and 90+grams of protein daily.   Exercise goals: As is.  Behavioral modification strategies: increasing lean protein intake, decreasing simple carbohydrates, and holiday eating strategies .  Anna Holland has agreed to follow-up with our clinic in 4 weeks. She was informed of the importance of frequent follow-up visits to maximize her success with intensive lifestyle modifications for her multiple health conditions.   Objective:   Blood pressure 113/64, pulse 66, temperature 98.5 F (36.9 C), height 5\' 1"  (1.549 m), weight 201 lb (91.2 kg), last menstrual period 03/09/2022, SpO2 99 %. Body mass index is 37.98 kg/m.  General: Cooperative, alert, well developed, in no acute distress. HEENT: Conjunctivae and lids unremarkable. Cardiovascular: Regular rhythm.  Lungs: Normal work of breathing. Neurologic: No focal deficits.   Lab Results  Component Value Date   CREATININE 0.54 03/10/2022   BUN 8 03/10/2022   NA 135 03/10/2022   K 3.4 (L) 03/10/2022   CL 100 03/10/2022   CO2 26 03/10/2022   Lab Results  Component Value Date   ALT 13 12/19/2021   AST 16 12/19/2021   ALKPHOS 78 12/19/2021   BILITOT 0.5 12/19/2021  Lab Results  Component Value Date   HGBA1C 5.2 02/20/2022   HGBA1C 5.1 10/31/2021   HGBA1C 5.4 04/17/2021   HGBA1C 5.4 12/20/2020   Lab Results  Component Value Date   INSULIN 8.3 02/20/2022   INSULIN 11.2 10/31/2021   INSULIN 9.0 04/17/2021   INSULIN 23.5 12/20/2020   Lab Results  Component Value Date   TSH 1.100 02/20/2022   Lab Results  Component Value Date   CHOL 197 10/31/2021    HDL 41 10/31/2021   LDLCALC 140 (H) 10/31/2021   LDLDIRECT 126.0 09/02/2017   TRIG 87 10/31/2021   CHOLHDL 4.8 (H) 10/31/2021   Lab Results  Component Value Date   VD25OH 64.0 02/20/2022   VD25OH 43.3 10/31/2021   VD25OH 40.0 04/17/2021   Lab Results  Component Value Date   WBC 13.4 (H) 03/10/2022   HGB 12.3 03/10/2022   HCT 37.1 03/10/2022   MCV 81.5 03/10/2022   PLT 344 03/10/2022   No results found for: "IRON", "TIBC", "FERRITIN"  Attestation Statements:   Reviewed by clinician on day of visit: allergies, medications, problem list, medical history, surgical history, family history, social history, and previous encounter notes.  I, Brendell Tyus, am acting as transcriptionist for [employer], PA.  I have reviewed the above documentation for accuracy and completeness, and I agree with the above. -  Javier Gell,PA-C

## 2022-04-10 ENCOUNTER — Ambulatory Visit (INDEPENDENT_AMBULATORY_CARE_PROVIDER_SITE_OTHER): Payer: BC Managed Care – PPO | Admitting: Physician Assistant

## 2022-04-15 ENCOUNTER — Ambulatory Visit (INDEPENDENT_AMBULATORY_CARE_PROVIDER_SITE_OTHER): Payer: BC Managed Care – PPO | Admitting: Physician Assistant

## 2022-04-15 ENCOUNTER — Encounter (INDEPENDENT_AMBULATORY_CARE_PROVIDER_SITE_OTHER): Payer: Self-pay | Admitting: Physician Assistant

## 2022-04-15 VITALS — BP 122/72 | HR 60 | Temp 98.1°F | Ht 61.0 in | Wt 202.0 lb

## 2022-04-15 DIAGNOSIS — E559 Vitamin D deficiency, unspecified: Secondary | ICD-10-CM | POA: Diagnosis not present

## 2022-04-15 DIAGNOSIS — R632 Polyphagia: Secondary | ICD-10-CM | POA: Diagnosis not present

## 2022-04-15 DIAGNOSIS — E669 Obesity, unspecified: Secondary | ICD-10-CM | POA: Diagnosis not present

## 2022-04-15 DIAGNOSIS — E88819 Insulin resistance, unspecified: Secondary | ICD-10-CM | POA: Diagnosis not present

## 2022-04-15 DIAGNOSIS — Z6838 Body mass index (BMI) 38.0-38.9, adult: Secondary | ICD-10-CM

## 2022-04-21 NOTE — Progress Notes (Unsigned)
Chief Complaint:   OBESITY Anna Holland is here to discuss her progress with her obesity treatment plan along with follow-up of her obesity related diagnoses. Anna Holland is on keeping a food journal and adhering to recommended goals of 1000-110 calories and 90+ grams protein and states she is following her eating plan approximately 70% of the time. Anna Holland states she is walking 30 minutes 4 times per week.  Today's visit was #: 22 Starting weight: 235 lbs Starting date: 12/20/2020 Today's weight: 202 lbs Today's date: 04/15/2022 Total lbs lost to date: 33 Total lbs lost since last in-office visit: +1  Interim History: Anna Holland has done well with weight loss. She had less structure over the holidays and is working to get back on track with eating plan. Her hunger is well controlled when she is on plan. 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 She plans to work in [employer] ER after graduation in May 2024.   Subjective:   1. Vitamin D deficiency Anna Holland's last Vitamin D level was 64.0 on 02/20/2022. She reports she is not always consistently taking Ergocalciferol weekly, but has no side effects. No refill needed.  2. Insulin resistance Anna Holland's last A1c was 5.2 with an insulin level of 8.3 on 02/20/2022. She is not on medication. Pt continue to work on decreasing simple carbs and increasing lean protein.  3. Polyphagia She does better when journaling and has structure to day to day. Pt was approved for [facility], but she is never able to obtain it due to national shortage.   Assessment/Plan:   1. Vitamin D deficiency Low Vitamin D level contributes to fatigue and are associated with obesity, breast, and colon cancer. She agrees to continue to take prescription Vitamin D 50,000 IU every week and will follow-up for routine testing of Vitamin D, at least 2-3 times per year to avoid over-replacement.  2. Insulin resistance Continue prudent nutritional plan and exercise.repeat labs in  2-3 months.  3. Polyphagia Discussed working to get back on track with protein goals to help decrease hunger and strengthening exercises to increase (***cut off on copy).  4. Obesity with current BMI of 38.2 Anna Holland is currently in the action stage of change. As such, her goal is to continue with weight loss efforts. She has agreed to the Category 1 Plan.   Mom, Dad, and pt helps with cooking at home. Handout for white chicken chili provided and discussed [email or web address] for recipes.  Exercise goals:  Add strengthening exercises 2-3 times a week.  Behavioral modification strategies: increasing lean protein intake, decreasing simple carbohydrates, meal planning and cooking strategies, and planning for success.  Anna Holland has agreed to follow-up with our clinic in 4-5 weeks. She was informed of the importance of frequent follow-up visits to maximize her success with intensive lifestyle modifications for her multiple health conditions.   Objective:   Blood pressure 122/72, pulse 60, temperature 98.1 F (36.7 C), height 5\' 1"  (1.549 m), weight 202 lb (91.6 kg), SpO2 98 %. Body mass index is 38.17 kg/m.  General: Cooperative, alert, well developed, in no acute distress. HEENT: Conjunctivae and lids unremarkable. Cardiovascular: Regular rhythm.  Lungs: Normal work of breathing. Neurologic: No focal deficits.   Lab Results  Component Value Date   CREATININE 0.54 03/10/2022   BUN 8 03/10/2022   NA 135 03/10/2022   K 3.4 (L) 03/10/2022   CL 100 03/10/2022   CO2 26 03/10/2022   Lab Results  Component Value Date  ALT 13 12/19/2021   AST 16 12/19/2021   ALKPHOS 78 12/19/2021   BILITOT 0.5 12/19/2021   Lab Results  Component Value Date   HGBA1C 5.2 02/20/2022   HGBA1C 5.1 10/31/2021   HGBA1C 5.4 04/17/2021   HGBA1C 5.4 12/20/2020   Lab Results  Component Value Date   INSULIN 8.3 02/20/2022   INSULIN 11.2 10/31/2021   INSULIN 9.0 04/17/2021   INSULIN 23.5 12/20/2020   Lab  Results  Component Value Date   TSH 1.100 02/20/2022   Lab Results  Component Value Date   CHOL 197 10/31/2021   HDL 41 10/31/2021   LDLCALC 140 (H) 10/31/2021   LDLDIRECT 126.0 09/02/2017   TRIG 87 10/31/2021   CHOLHDL 4.8 (H) 10/31/2021   Lab Results  Component Value Date   VD25OH 64.0 02/20/2022   VD25OH 43.3 10/31/2021   VD25OH 40.0 04/17/2021   Lab Results  Component Value Date   WBC 13.4 (H) 03/10/2022   HGB 12.3 03/10/2022   HCT 37.1 03/10/2022   MCV 81.5 03/10/2022   PLT 344 03/10/2022   Attestation Statements:   Reviewed by clinician on day of visit: allergies, medications, problem list, medical history, surgical history, family history, social history, and previous encounter notes.  I, Kathlene November, BS, CMA, am acting as transcriptionist for [employer] [place], PA-C.  I have reviewed the above documentation for accuracy and completeness, and I agree with the above. -  ***

## 2022-05-21 ENCOUNTER — Ambulatory Visit (INDEPENDENT_AMBULATORY_CARE_PROVIDER_SITE_OTHER): Payer: BC Managed Care – PPO | Admitting: Physician Assistant

## 2022-05-21 ENCOUNTER — Encounter (INDEPENDENT_AMBULATORY_CARE_PROVIDER_SITE_OTHER): Payer: Self-pay | Admitting: Physician Assistant

## 2022-05-21 VITALS — BP 123/75 | HR 66 | Temp 98.8°F | Ht 61.0 in | Wt 203.0 lb

## 2022-05-21 DIAGNOSIS — E669 Obesity, unspecified: Secondary | ICD-10-CM | POA: Diagnosis not present

## 2022-05-21 DIAGNOSIS — Z6838 Body mass index (BMI) 38.0-38.9, adult: Secondary | ICD-10-CM

## 2022-05-21 DIAGNOSIS — E559 Vitamin D deficiency, unspecified: Secondary | ICD-10-CM | POA: Diagnosis not present

## 2022-05-21 DIAGNOSIS — E88819 Insulin resistance, unspecified: Secondary | ICD-10-CM

## 2022-05-21 MED ORDER — VITAMIN D (ERGOCALCIFEROL) 1.25 MG (50000 UNIT) PO CAPS: 50000.0000 [IU] | ORAL_CAPSULE | ORAL | 0 refills | Status: DC

## 2022-05-22 NOTE — Progress Notes (Signed)
Chief Complaint:   OBESITY Anna Holland is here to discuss her progress with her obesity treatment plan along with follow-up of her obesity related diagnoses. Anna Holland is on the Category 1 Plan and states she is following her eating plan approximately 75-85% of the time. Anna Holland states she is walking 30-45 minutes 3-4 times per week.  Today's visit was #: 23 Starting weight: 235 lbs Starting date: 12/20/2020 Today's weight: 203 lbs Today's date: 05/22/2022 Total lbs lost to date: 33 lbs Total lbs lost since last in-office visit: + 1  Interim History: Anna Holland has done well overall with weight loss. 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  She does have midterms next week.  She also has her birthday and spring break coming up and we discussed things she can do to truly take a break/attend to self-care.  She does miss pleasure reading. She has been trying to get more sleep. She has been snacking more in the evenings after dinner-eating bananas, cereal or peanut butter and jelly sandwiches and we discussed better snacking and fruit options.  Subjective:   1. Insulin resistance A1c 5.2-at goal/insulin 8.3 nearing goal on 02/20/2022.  She is not on medication.  She is working on her nutritional plan to decrease simple carbohydrates and increase lean protein and exercise to promote weight loss and decrease insulin resistance/improve glycemic control.  2. Vitamin D deficiency Vitamin D level 64 on 02/20/2022-at goal.  She was changed to ergocalciferol every 14 days.  She reports no nausea vomiting or muscle weakness with the ergocalciferol.  3. Obesity (HCC)-START BMI 45.90   4. BMI 38.0-38.9,adult, Current bmi 38.4    Assessment/Plan:   1. Insulin resistance She will continue to work on her nutrition plan to decrease simple carbohydrates, increase lean protein and exercise to promote weight  loss and improve insulin resistance/glycemic control/prevent progression to type 2 diabetes.  2. Vitamin D deficiency She will continue on ergocalciferol 50,000 IU every 14 days.  Will recheck a level 2-3 times yearly to avoid oversupplementation. - Vitamin D, Ergocalciferol, (DRISDOL) 1.25 MG (50000 UNIT) CAPS capsule; Take 1 capsule (50,000 Units total) by mouth every 14 (fourteen) days.  Dispense: 4 capsule; Refill: 0  3. Obesity (HCC)-START BMI 45.90   4. BMI 38.0-38.9,adult, Current bmi 38.4   Anna Holland is currently in the action stage of change. As such, her goal is to continue with weight loss efforts. She has agreed to the Category 1 Plan.   Exercise goals: All adults should avoid inactivity. Some physical activity is better than none, and adults who participate in any amount of physical activity gain some health benefits.  Behavioral modification strategies: increasing lean protein intake, decreasing simple carbohydrates, increasing water intake, no skipping meals, and better snacking choices.  Anna Holland has agreed to follow-up with our clinic in 4 weeks. She was informed of the importance of frequent follow-up visits to maximize her success with intensive lifestyle modifications for her multiple health conditions.     Objective:   Blood pressure 123/75, pulse 66, temperature 98.8 F (37.1 C), height 5' 1"$  (1.549 m), weight 203 lb (92.1 kg), SpO2 99 %. Body mass index is 38.36 kg/m.  General: Cooperative, alert, well developed, in no acute distress. HEENT: Conjunctivae and lids unremarkable. Cardiovascular: Regular rhythm.  Lungs: Normal work of breathing. Neurologic: No focal deficits.   Lab Results  Component Value Date  CREATININE 0.54 03/10/2022   BUN 8 03/10/2022   NA 135 03/10/2022   K 3.4 (L) 03/10/2022   CL 100 03/10/2022   CO2 26 03/10/2022   Lab Results  Component Value Date   ALT 13 12/19/2021   AST 16 12/19/2021   ALKPHOS 78 12/19/2021   BILITOT 0.5  12/19/2021   Lab Results  Component Value Date   HGBA1C 5.2 02/20/2022   HGBA1C 5.1 10/31/2021   HGBA1C 5.4 04/17/2021   HGBA1C 5.4 12/20/2020   Lab Results  Component Value Date   INSULIN 8.3 02/20/2022   INSULIN 11.2 10/31/2021   INSULIN 9.0 04/17/2021   INSULIN 23.5 12/20/2020   Lab Results  Component Value Date   TSH 1.100 02/20/2022   Lab Results  Component Value Date   CHOL 197 10/31/2021   HDL 41 10/31/2021   LDLCALC 140 (H) 10/31/2021   LDLDIRECT 126.0 09/02/2017   TRIG 87 10/31/2021   CHOLHDL 4.8 (H) 10/31/2021   Lab Results  Component Value Date   VD25OH 64.0 02/20/2022   VD25OH 43.3 10/31/2021   VD25OH 40.0 04/17/2021   Lab Results  Component Value Date   WBC 13.4 (H) 03/10/2022   HGB 12.3 03/10/2022   HCT 37.1 03/10/2022   MCV 81.5 03/10/2022   PLT 344 03/10/2022   No results found for: "IRON", "TIBC", "FERRITIN"  Obesity Behavioral Intervention:   Approximately 15 minutes were spent on the discussion below.  ASK: We discussed the diagnosis of obesity with Anna Holland today and Anna Holland agreed to give [place] permission to discuss obesity behavioral modification therapy today.  ASSESS: Anna Holland has the diagnosis of obesity and her BMI today is 38.4. Yevonne is in the action stage of change.   ADVISE: Anna Holland was educated on the multiple health risks of obesity as well as the benefit of weight loss to improve her health. She was advised of the need for long term treatment and the importance of lifestyle modifications to improve her current health and to decrease her risk of future health problems.  AGREE: Multiple dietary modification options and treatment options were discussed and Anna Holland agreed to follow the recommendations documented in the above note.  ARRANGE: Anna Holland was educated on the importance of frequent visits to treat obesity as outlined per CMS and USPSTF guidelines and agreed to schedule her next follow up appointment today.  Attestation  Statements:   Reviewed by clinician on day of visit: allergies, medications, problem list, medical history, surgical history, family history, social history, and previous encounter notes. Macsen Nuttall,PA-C

## 2022-06-25 ENCOUNTER — Encounter (INDEPENDENT_AMBULATORY_CARE_PROVIDER_SITE_OTHER): Payer: Self-pay | Admitting: Physician Assistant

## 2022-06-25 ENCOUNTER — Ambulatory Visit (INDEPENDENT_AMBULATORY_CARE_PROVIDER_SITE_OTHER): Payer: BC Managed Care – PPO | Admitting: Physician Assistant

## 2022-06-25 VITALS — BP 95/69 | HR 70 | Temp 97.9°F | Ht 61.0 in

## 2022-06-25 DIAGNOSIS — E88819 Insulin resistance, unspecified: Secondary | ICD-10-CM | POA: Diagnosis not present

## 2022-06-25 DIAGNOSIS — E559 Vitamin D deficiency, unspecified: Secondary | ICD-10-CM | POA: Diagnosis not present

## 2022-06-25 DIAGNOSIS — Z6838 Body mass index (BMI) 38.0-38.9, adult: Secondary | ICD-10-CM | POA: Insufficient documentation

## 2022-06-25 DIAGNOSIS — Z6839 Body mass index (BMI) 39.0-39.9, adult: Secondary | ICD-10-CM | POA: Insufficient documentation

## 2022-06-25 NOTE — Assessment & Plan Note (Signed)
Insulin Resistance Last fasting insulin was 8.3. A1c was 5.2. Polyphagia:No Medication(s): None. Lab Results  Component Value Date   HGBA1C 5.2 02/20/2022   HGBA1C 5.1 10/31/2021   HGBA1C 5.4 04/17/2021   HGBA1C 5.4 12/20/2020   Lab Results  Component Value Date   INSULIN 8.3 02/20/2022   INSULIN 11.2 10/31/2021   INSULIN 9.0 04/17/2021   INSULIN 23.5 12/20/2020    Plan:  Continue working on nutrition plan to decrease simple carbohydrates, increase lean proteins and exercise to promote weight loss, improve glycemic control and prevent progression to Type 2 diabetes.  Increase protein intake. Increase fiber intake.

## 2022-06-25 NOTE — Progress Notes (Signed)
Office: [phone]  /  Fax: [phone]  WEIGHT SUMMARY AND BIOMETRICS  Vitals Temp: 97.9 F (36.6 C) BP: 95/69 Pulse Rate: 70 SpO2: 100 %   Anthropometric Measurements Height: 5\' 1"  (1.549 m) Weight at Last Visit: 203 lb Weight Lost Since Last Visit: 0 lb Weight Gained Since Last Visit: 1 lb Starting Weight: 235 lb   Body Composition  Body Fat %: 45.1 % Fat Mass (lbs): 92 lbs Muscle Mass (lbs): 106.6 lbs Total Body Water (lbs): 82 lbs Visceral Fat Rating : 10   Other Clinical Data Fasting: No Labs: No Today's Visit #: 24 Starting Date: 12/20/20     HPI  Chief Complaint: OBESITY  Anna Holland is here to discuss her progress with her obesity treatment plan. She is on the the Category 1 Plan and states she is following her eating plan approximately 75-80 % of the time. She states she is exercising walking dog 20-30 minutes 3-4 times per week.   Interval History:  Since last office visit she has done well overall with weight loss. Total of 33 lbs! 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 Feels like she need to move more. She does walk the dog 3-4 x weekly and has been doing some dance videos. She was able to read for pleasure a bit over her spring break.  Feels she will have more time to focus once graduates from nursing school.  Supplements with protein shakes due to busy schedule.    PHYSICAL EXAM:  Blood pressure 95/69, pulse 70, temperature 97.9 F (36.6 C), height 5\' 1"  (1.549 m), SpO2 100 %. Body mass index is 38.36 kg/m.  General: She is overweight, cooperative, alert, well developed, and in no acute distress. PSYCH: Has normal mood, affect and thought process.   Lungs: Normal breathing effort, no conversational dyspnea.  DIAGNOSTIC DATA REVIEWED:  BMET    Component Value Date/Time   NA 135 03/10/2022 0054   NA 138 10/31/2021 0900   K 3.4 (L) 03/10/2022 0054   CL 100 03/10/2022 0054   CO2 26  03/10/2022 0054   GLUCOSE 117 (H) 03/10/2022 0054   BUN 8 03/10/2022 0054   BUN 12 10/31/2021 0900   CREATININE 0.54 03/10/2022 0054   CALCIUM 9.5 03/10/2022 0054   GFRNONAA >60 03/10/2022 0054   GFRAA NOT CALCULATED 12/29/2012 2056   Lab Results  Component Value Date   HGBA1C 5.2 02/20/2022   HGBA1C 5.4 12/20/2020   Lab Results  Component Value Date   INSULIN 8.3 02/20/2022   INSULIN 23.5 12/20/2020   Lab Results  Component Value Date   TSH 1.100 02/20/2022   CBC    Component Value Date/Time   WBC 13.4 (H) 03/10/2022 0054   RBC 4.55 03/10/2022 0054   HGB 12.3 03/10/2022 0054   HCT 37.1 03/10/2022 0054   PLT 344 03/10/2022 0054   MCV 81.5 03/10/2022 0054   MCH 27.0 03/10/2022 0054   MCHC 33.2 03/10/2022 0054   RDW 13.0 03/10/2022 0054   Iron Studies No results found for: "IRON", "TIBC", "FERRITIN", "IRONPCTSAT" Lipid Panel     Component Value Date/Time   CHOL 197 10/31/2021 0900   TRIG 87 10/31/2021 0900   HDL 41 10/31/2021 0900   CHOLHDL 4.8 (H) 10/31/2021 0900   CHOLHDL 5 06/13/2020 0812   VLDL 23.2 06/13/2020 0812   LDLCALC 140 (H) 10/31/2021 0900   LDLDIRECT 126.0 09/02/2017 1452   Hepatic Function Panel  Component Value Date/Time   PROT 7.8 12/19/2021 1249   PROT 7.6 10/31/2021 0900   ALBUMIN 3.6 12/19/2021 1249   ALBUMIN 4.1 10/31/2021 0900   AST 16 12/19/2021 1249   ALT 13 12/19/2021 1249   ALKPHOS 78 12/19/2021 1249   BILITOT 0.5 12/19/2021 1249   BILITOT 0.2 10/31/2021 0900   BILIDIR 0.1 06/13/2020 0812      Component Value Date/Time   TSH 1.100 02/20/2022 1123   Nutritional Lab Results  Component Value Date   VD25OH 64.0 02/20/2022   VD25OH 43.3 10/31/2021   VD25OH 40.0 04/17/2021    ASSOCIATED CONDITIONS ADDRESSED TODAY  ASSESSMENT AND PLAN  Problem List Items Addressed This Visit     Morbid obesity ([place])   Insulin resistance - Primary    Insulin Resistance Last fasting insulin was 8.3. A1c was  5.2. Polyphagia:No Medication(s): None. Lab Results  Component Value Date   HGBA1C 5.2 02/20/2022   HGBA1C 5.1 10/31/2021   HGBA1C 5.4 04/17/2021   HGBA1C 5.4 12/20/2020   Lab Results  Component Value Date   INSULIN 8.3 02/20/2022   INSULIN 11.2 10/31/2021   INSULIN 9.0 04/17/2021   INSULIN 23.5 12/20/2020   Plan:  Continue working on nutrition plan to decrease simple carbohydrates, increase lean proteins and exercise to promote weight loss, improve glycemic control and prevent progression to Type 2 diabetes.  Increase protein intake. Increase fiber intake.        Vitamin D deficiency    Vitamin D Deficiency Vitamin D is at goal of 50.  Most recent vitamin D level was 64.0. She is on  prescription ergocalciferol 50,000 IU every 14 days. Lab Results  Component Value Date   VD25OH 64.0 02/20/2022   VD25OH 43.3 10/31/2021   VD25OH 40.0 04/17/2021   Plan: Continue prescription vitamin D 50,000 IU every other week. Will plan to recheck Vit D level next visit.        BMI 38.0-38.9,adult, Current bmi 38.4      TREATMENT PLAN FOR OBESITY:  Recommended Dietary Goals  Anna Holland is currently in the action stage of change. As such, her goal is to continue weight management plan. She has agreed to the Category 1 Plan.  Behavioral Intervention  We discussed the following Behavioral Modification Strategies today: increasing lean protein intake, decreasing simple carbohydrates , increasing vegetables, increasing water intake, work on meal planning and easy cooking plans, planning for success, and keeping healthy foods at home.  Additional resources provided today: NA  Recommended Physical Activity Goals  Anna Holland has been advised to work up to 150 minutes of moderate intensity aerobic activity a week and strengthening exercises 2-3 times per week for cardiovascular health, weight loss maintenance and preservation of muscle mass.   She has agreed to Continue current level of  physical activity     Return in about 5 weeks (around 07/30/2022) for Fasting Lab.Marland Kitchen She was informed of the importance of frequent follow up visits to maximize her success with intensive lifestyle modifications for her multiple health conditions.   ATTESTASTION STATEMENTS:  Reviewed by clinician on day of visit: allergies, medications, problem list, medical history, surgical history, family history, social history, and previous encounter notes.   I have personally spent 34 minutes total time today in preparation, patient care, nutritional counseling and documentation for this visit, including the following: review of clinical lab tests; review of medical tests/procedures/services.      Erick Oxendine, PA-C

## 2022-06-25 NOTE — Assessment & Plan Note (Signed)
Vitamin D Deficiency Vitamin D is at goal of 50.  Most recent vitamin D level was 64.0. She is on  prescription ergocalciferol 50,000 IU every 14 days. Lab Results  Component Value Date   VD25OH 64.0 02/20/2022   VD25OH 43.3 10/31/2021   VD25OH 40.0 04/17/2021    Plan: Continue prescription vitamin D 50,000 IU every other week. Will plan to recheck Vit D level next visit.
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
## 2022-07-06 ENCOUNTER — Other Ambulatory Visit: Payer: Self-pay | Admitting: Family Medicine

## 2022-07-07 ENCOUNTER — Telehealth (INDEPENDENT_AMBULATORY_CARE_PROVIDER_SITE_OTHER): Payer: BC Managed Care – PPO | Admitting: Psychology

## 2022-07-07 DIAGNOSIS — F5089 Other specified eating disorder: Secondary | ICD-10-CM | POA: Diagnosis not present

## 2022-07-08 ENCOUNTER — Other Ambulatory Visit: Payer: Self-pay

## 2022-07-08 MED ORDER — PANTOPRAZOLE SODIUM 40 MG PO TBEC: 40.0000 mg | DELAYED_RELEASE_TABLET | Freq: Every day | ORAL | 1 refills | Status: DC

## 2022-08-04 ENCOUNTER — Encounter (INDEPENDENT_AMBULATORY_CARE_PROVIDER_SITE_OTHER): Payer: Self-pay | Admitting: Physician Assistant

## 2022-08-04 ENCOUNTER — Telehealth (INDEPENDENT_AMBULATORY_CARE_PROVIDER_SITE_OTHER): Payer: BC Managed Care – PPO | Admitting: Psychology

## 2022-08-04 ENCOUNTER — Ambulatory Visit (INDEPENDENT_AMBULATORY_CARE_PROVIDER_SITE_OTHER): Payer: BC Managed Care – PPO | Admitting: Physician Assistant

## 2022-08-04 VITALS — BP 94/67 | HR 71 | Temp 97.7°F | Ht 61.0 in | Wt 205.0 lb

## 2022-08-04 DIAGNOSIS — E785 Hyperlipidemia, unspecified: Secondary | ICD-10-CM

## 2022-08-04 DIAGNOSIS — F5089 Other specified eating disorder: Secondary | ICD-10-CM | POA: Diagnosis not present

## 2022-08-04 DIAGNOSIS — E88819 Insulin resistance, unspecified: Secondary | ICD-10-CM

## 2022-08-04 DIAGNOSIS — R5383 Other fatigue: Secondary | ICD-10-CM | POA: Diagnosis not present

## 2022-08-04 DIAGNOSIS — E786 Lipoprotein deficiency: Secondary | ICD-10-CM

## 2022-08-04 DIAGNOSIS — E559 Vitamin D deficiency, unspecified: Secondary | ICD-10-CM | POA: Diagnosis not present

## 2022-08-04 DIAGNOSIS — Z6838 Body mass index (BMI) 38.0-38.9, adult: Secondary | ICD-10-CM

## 2022-08-04 DIAGNOSIS — E669 Obesity, unspecified: Secondary | ICD-10-CM

## 2022-08-04 NOTE — Progress Notes (Signed)
Office: [phone]  /  Fax: [phone]  WEIGHT SUMMARY AND BIOMETRICS  Vitals Temp: 97.7 F (36.5 C) BP: 94/67 Pulse Rate: 71 SpO2: 99 %   Anthropometric Measurements Height: 5\' 1"  (1.549 m) Weight: 205 lb (93 kg) BMI (Calculated): 38.75 Weight at Last Visit: 204 lb Weight Lost Since Last Visit: 0 lb Weight Gained Since Last Visit: 1 lb Starting Weight: 235 lb Total Weight Loss (lbs): 30 lb (13.6 kg)   Body Composition  Body Fat %: 45.2 % Fat Mass (lbs): 92.8 lbs Muscle Mass (lbs): 106.8 lbs Total Body Water (lbs): 84 lbs Visceral Fat Rating : 10   Other Clinical Data Fasting: yes Labs: yes Today's Visit #: 25 Starting Date: 12/20/20     HPI  Chief Complaint: OBESITY  Anna Holland is here to discuss her progress with her obesity treatment plan. She is on the the Category 1 Plan and states she is following her eating plan approximately 75 % of the time. She states she is exercising 0 minutes 0 times per week.   Interval History:  Since last office visit she is  up 0.2 lbs muscle mass, up 0.8 lb adipose mass. 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 She is working, but planning to work 3-12 hr shifts, so will have time to study and more time to focus on nutrition plan and cooking as well as having more time to exercise. She has vacation scheduled for June.   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  Pharmacotherapy: None for weight loss.   PHYSICAL EXAM:  Blood pressure 94/67, pulse 71, temperature 97.7 F (36.5 C), height 5\' 1"  (1.549 m), weight 205 lb (93 kg), SpO2 99 %. Body mass index is 38.73 kg/m.  General: She is overweight, cooperative, alert, well developed, and in no acute distress. PSYCH: Has normal mood, affect and thought process.   Cardiovascular: HR 70's regular Lungs: Normal breathing effort, no conversational dyspnea. Neuro: no focal deficits  DIAGNOSTIC DATA REVIEWED:  BMET     Component Value Date/Time   NA 135 03/10/2022 0054   NA 138 10/31/2021 0900   K 3.4 (L) 03/10/2022 0054   CL 100 03/10/2022 0054   CO2 26 03/10/2022 0054   GLUCOSE 117 (H) 03/10/2022 0054   BUN 8 03/10/2022 0054   BUN 12 10/31/2021 0900   CREATININE 0.54 03/10/2022 0054   CALCIUM 9.5 03/10/2022 0054   GFRNONAA >60 03/10/2022 0054   GFRAA NOT CALCULATED 12/29/2012 2056   Lab Results  Component Value Date   HGBA1C 5.2 02/20/2022   HGBA1C 5.4 12/20/2020   Lab Results  Component Value Date   INSULIN 8.3 02/20/2022   INSULIN 23.5 12/20/2020   Lab Results  Component Value Date   TSH 1.100 02/20/2022   CBC    Component Value Date/Time   WBC 13.4 (H) 03/10/2022 0054   RBC 4.55 03/10/2022 0054   HGB 12.3 03/10/2022 0054   HCT 37.1 03/10/2022 0054   PLT 344 03/10/2022 0054   MCV 81.5 03/10/2022 0054   MCH 27.0 03/10/2022 0054   MCHC 33.2 03/10/2022 0054   RDW 13.0 03/10/2022 0054   Iron Studies No results found for: "IRON", "TIBC", "FERRITIN", "IRONPCTSAT" Lipid Panel     Component Value Date/Time   CHOL 197 10/31/2021 0900   TRIG 87 10/31/2021 0900   HDL 41 10/31/2021 0900   CHOLHDL 4.8 (H) 10/31/2021 0900   CHOLHDL 5 06/13/2020 0812   VLDL  23.2 06/13/2020 0812   LDLCALC 140 (H) 10/31/2021 0900   LDLDIRECT 126.0 09/02/2017 1452   Hepatic Function Panel     Component Value Date/Time   PROT 7.8 12/19/2021 1249   PROT 7.6 10/31/2021 0900   ALBUMIN 3.6 12/19/2021 1249   ALBUMIN 4.1 10/31/2021 0900   AST 16 12/19/2021 1249   ALT 13 12/19/2021 1249   ALKPHOS 78 12/19/2021 1249   BILITOT 0.5 12/19/2021 1249   BILITOT 0.2 10/31/2021 0900   BILIDIR 0.1 06/13/2020 0812      Component Value Date/Time   TSH 1.100 02/20/2022 1123   Nutritional Lab Results  Component Value Date   VD25OH 64.0 02/20/2022   VD25OH 43.3 10/31/2021   VD25OH 40.0 04/17/2021    ASSOCIATED CONDITIONS ADDRESSED TODAY  ASSESSMENT AND PLAN  Problem List Items Addressed This Visit      Morbid obesity (HCC)   Other fatigue    Endorses fatigue.  Plan: Recheck labs today and supplement as indicated.       Relevant Orders   Vitamin B12   CBC with Differential/Platelet   TSH   Insulin resistance - Primary    Insulin Resistance Last fasting insulin was 8.3- not at goal. A1c was 5.2- at goal. Polyphagia:No Medication(s): None She is working on nutrition plan to decrease simple carbohydrates, increase lean proteins and exercise to promote weight loss, improve glycemic control and prevent progression to Type 2 diabetes.   Lab Results  Component Value Date   HGBA1C 5.2 02/20/2022   HGBA1C 5.1 10/31/2021   HGBA1C 5.4 04/17/2021   HGBA1C 5.4 12/20/2020   Lab Results  Component Value Date   INSULIN 8.3 02/20/2022   INSULIN 11.2 10/31/2021   INSULIN 9.0 04/17/2021   INSULIN 23.5 12/20/2020   Plan:  Continue working on nutrition plan to decrease simple carbohydrates, increase lean proteins and exercise to promote weight loss, improve glycemic control and prevent progression to Type 2 diabetes.  Increase protein intake. Increase fiber intake.  Recheck labs today.       Relevant Orders   CMP14+EGFR   Hemoglobin A1c   Insulin, random   Vitamin D deficiency    Vitamin D Deficiency Vitamin D is at goal of 50.  Most recent vitamin D level was 64.0. She is on  prescription ergocalciferol 50,000 IU every 14 days. Lab Results  Component Value Date   VD25OH 64.0 02/20/2022   VD25OH 43.3 10/31/2021   VD25OH 40.0 04/17/2021   Plan: Continue  prescription ergocalciferol 50,000 IU every 14 days Low vitamin D levels can be associated with adiposity and may result in leptin resistance and weight gain. Also associated with fatigue. Currently on vitamin D supplementation without any adverse effects.  Recheck Vitamin D level today       Relevant Orders   VITAMIN D 25 Hydroxy (Vit-D Deficiency, Fractures)   BMI 38.0-38.9,adult, Current bmi 38.4   Hyperlipidemia with  low HDL    Hyperlipidemia LDL is not at goal. Medication(s): None Cardiovascular risk factors: dyslipidemia and obesity (BMI >= 30 kg/m2)  Lab Results  Component Value Date   CHOL 197 10/31/2021   HDL 41 10/31/2021   LDLCALC 140 (H) 10/31/2021   LDLDIRECT 126.0 09/02/2017   TRIG 87 10/31/2021   CHOLHDL 4.8 (H) 10/31/2021   CHOLHDL 5.3 (H) 04/17/2021   CHOLHDL 5 06/13/2020   Lab Results  Component Value Date   ALT 13 12/19/2021   AST 16 12/19/2021   ALKPHOS 78 12/19/2021   BILITOT 0.5  12/19/2021  The ASCVD Risk score (Arnett DK, et al., 2019) failed to calculate for the following reasons:   The 2019 ASCVD risk score is only valid for ages 44 to 22  Plan:  Continue to work on Engineer, technical sales -decreasing simple carbohydrates, increasing lean proteins, decreasing saturated fats and cholesterol and exercise as able to promote weight loss, improve lipids and decrease cardiovascular risks.  Recheck fasting lipid panel today.          Relevant Orders   Lipid Panel With LDL/HDL Ratio   Current BMI 38.8 TBW loss of 12.8%  TREATMENT PLAN FOR OBESITY:  Recommended Dietary Goals  Sundeep is currently in the action stage of change. As such, her goal is to continue weight management plan. She has agreed to the Category 1 Plan.  Behavioral Intervention  We discussed the following Behavioral Modification Strategies today: increasing lean protein intake, decreasing simple carbohydrates , increasing vegetables, avoiding skipping meals, increasing water intake, work on tracking and journaling calories using tracking application, continue to practice mindfulness when eating, and planning for success.  Additional resources provided today: NA  Recommended Physical Activity Goals  Sherrika has been advised to work up to 150 minutes of moderate intensity aerobic activity a week and strengthening exercises 2-3 times per week for cardiovascular health, weight loss maintenance and  preservation of muscle mass.   She has agreed to Continue current level of physical activity  and Start aerobic activity with a goal of 150 minutes a week at moderate intensity.    Pharmacotherapy We discussed various medication options to help Shadawn with her weight loss efforts and we both agreed to continue to work on nutritional and behavioral strategies to promote weight loss.   She was informed we would discuss her lab results at her next visit unless there is a critical issue that needs to be addressed sooner. She agreed to keep her next visit at the agreed upon time to discuss these results.     Return in about 4 weeks (around 09/01/2022).Marland Kitchen She was informed of the importance of frequent follow up visits to maximize her success with intensive lifestyle modifications for her multiple health conditions.   ATTESTASTION STATEMENTS:  Reviewed by clinician on day of visit: allergies, medications, problem list, medical history, surgical history, family history, social history, and previous encounter notes.   I have personally spent 35 minutes total time today in preparation, patient care, nutritional counseling and documentation for this visit, including the following: review of clinical lab tests; review of medical tests/procedures/services.      Mayli Covington, PA-C

## 2022-08-04 NOTE — Assessment & Plan Note (Signed)
Vitamin D Deficiency Vitamin D is at goal of 50.  Most recent vitamin D level was 64.0. She is on  prescription ergocalciferol 50,000 IU every 14 days. Lab Results  Component Value Date   VD25OH 64.0 02/20/2022   VD25OH 43.3 10/31/2021   VD25OH 40.0 04/17/2021    Plan: Continue  prescription ergocalciferol 50,000 IU every 14 days Low vitamin D levels can be associated with adiposity and may result in leptin resistance and weight gain. Also associated with fatigue. Currently on vitamin D supplementation without any adverse effects.  Recheck Vitamin D level today

## 2022-08-04 NOTE — Assessment & Plan Note (Signed)
Insulin Resistance Last fasting insulin was 8.3- not at goal. A1c was 5.2- at goal. Polyphagia:No Medication(s): None She is working on nutrition plan to decrease simple carbohydrates, increase lean proteins and exercise to promote weight loss, improve glycemic control and prevent progression to Type 2 diabetes.   Lab Results  Component Value Date   HGBA1C 5.2 02/20/2022   HGBA1C 5.1 10/31/2021   HGBA1C 5.4 04/17/2021   HGBA1C 5.4 12/20/2020   Lab Results  Component Value Date   INSULIN 8.3 02/20/2022   INSULIN 11.2 10/31/2021   INSULIN 9.0 04/17/2021   INSULIN 23.5 12/20/2020    Plan:  Continue working on nutrition plan to decrease simple carbohydrates, increase lean proteins and exercise to promote weight loss, improve glycemic control and prevent progression to Type 2 diabetes.  Increase protein intake. Increase fiber intake.  Recheck labs today.

## 2022-08-04 NOTE — Assessment & Plan Note (Signed)
Hyperlipidemia LDL is not at goal. Medication(s): None Cardiovascular risk factors: dyslipidemia and obesity (BMI >= 30 kg/m2)  Lab Results  Component Value Date   CHOL 197 10/31/2021   HDL 41 10/31/2021   LDLCALC 140 (H) 10/31/2021   LDLDIRECT 126.0 09/02/2017   TRIG 87 10/31/2021   CHOLHDL 4.8 (H) 10/31/2021   CHOLHDL 5.3 (H) 04/17/2021   CHOLHDL 5 06/13/2020   Lab Results  Component Value Date   ALT 13 12/19/2021   AST 16 12/19/2021   ALKPHOS 78 12/19/2021   BILITOT 0.5 12/19/2021   The ASCVD Risk score (Arnett DK, et al., 2019) failed to calculate for the following reasons:   The 2019 ASCVD risk score is only valid for ages 53 to 12  Plan:  Continue to work on Engineer, technical sales -decreasing simple carbohydrates, increasing lean proteins, decreasing saturated fats and cholesterol and exercise as able to promote weight loss, improve lipids and decrease cardiovascular risks.  Recheck fasting lipid panel today.

## 2022-08-04 NOTE — Assessment & Plan Note (Signed)
Endorses fatigue.  Plan: Recheck labs today and supplement as indicated.

## 2022-08-04 NOTE — Progress Notes (Signed)
  Office: [phone]  /  Fax: [phone]    Date: August 04, 2022    Appointment Start Time: 10:04am Duration: 21 minutes Provider: Lawerance Cruel, Psy.D. Type of Session: Individual Therapy  Location of Patient: Home (private location) Location of Provider: Provider's Home (private office) Type of Contact: Telepsychological Visit via MyChart Video Visit  Session Content: Anna Holland is a 22 y.o. female presenting for a follow-up appointment to address the previously established treatment goal of increasing coping skills.Today's appointment was a telepsychological visit. [name] provided verbal consent for today's telepsychological appointment and she is aware she is responsible for securing confidentiality on her end of the session. Prior to proceeding with today's appointment, [name]'s physical location at the time of this appointment was obtained as well a phone number she could be reached at in the event of technical difficulties. [name] and this provider participated in today's telepsychological service.   This provider conducted a brief check-in. [name] stated she is doing "okay," but acknowledged some stress due to it being the end of the semester. Reviewed emotional and physical hunger. [name] reported ongoing sleep-related concerns. Further explored and processed. Due to ongoing sleeping difficulties, psychoeducation regarding sleep hygiene was provided. [name] provided verbal consent during today's appointment for this provider to send a handout about sleep hygiene via e-mail. Overall, [name] was receptive to today's appointment as evidenced by openness to sharing, responsiveness to feedback, and willingness to implement sleep hygiene techniques.   Mental Status Examination:  Appearance: neat Behavior: appropriate to circumstances Mood: neutral Affect: mood congruent Speech: WNL Eye Contact: appropriate Psychomotor Activity: WNL Gait: unable to assess Thought Process: linear, logical,  and goal directed and no evidence or endorsement of suicidal, homicidal, and self-harm ideation, plan and intent  Thought Content/Perception: no hallucinations, delusions, bizarre thinking or behavior endorsed or observed Orientation: AAOx4 Memory/Concentration: memory, attention, language, and fund of knowledge intact  Insight: fair Judgment: fair  Interventions:  Conducted a brief chart review Provided empathic reflections and validation Reviewed content from the previous session Provided positive reinforcement Employed supportive psychotherapy interventions to facilitate reduced distress and to improve coping skills with identified stressors Psychoeducation provided regarding sleep hygiene  DSM-5 Diagnosis(es): F50.89 Other Specified Feeding or Eating Disorder, Emotional Eating Behaviors  Treatment Goal & Progress: During the initial appointment with this provider, the following treatment goal was established: increase coping skills. Progress is limited, as [name] has just begun treatment with this provider; however, she is receptive to the interaction and interventions and rapport is being established.   Plan: The next appointment is scheduled for 08/18/2022 at 11am, which will be via MyChart Video Visit. The next session will focus on working towards the established treatment goal.

## 2022-08-05 LAB — LIPID PANEL WITH LDL/HDL RATIO
Cholesterol, Total: 144 mg/dL (ref 100–199)
HDL: 33 mg/dL — ABNORMAL LOW (ref >39)
LDL Chol Calc (NIH): 97 mg/dL (ref 0–99)
LDL/HDL Ratio: 2.9 ratio (ref 0.0–3.2)
Triglycerides: 70 mg/dL (ref 0–149)
VLDL Cholesterol Cal: 14 mg/dL (ref 5–40)

## 2022-08-05 LAB — CMP14+EGFR
ALT: 15 IU/L (ref 0–32)
AST: 15 IU/L (ref 0–40)
Albumin/Globulin Ratio: 1.2 (ref 1.2–2.2)
Albumin: 3.7 g/dL — ABNORMAL LOW (ref 4.0–5.0)
Alkaline Phosphatase: 92 IU/L (ref 44–121)
BUN/Creatinine Ratio: 17 (ref 9–23)
BUN: 9 mg/dL (ref 6–20)
Bilirubin Total: 0.3 mg/dL (ref 0.0–1.2)
CO2: 21 mmol/L (ref 20–29)
Calcium: 9.2 mg/dL (ref 8.7–10.2)
Chloride: 104 mmol/L (ref 96–106)
Creatinine, Ser: 0.54 mg/dL — ABNORMAL LOW (ref 0.57–1.00)
Globulin, Total: 3 g/dL (ref 1.5–4.5)
Glucose: 78 mg/dL (ref 70–99)
Potassium: 4.3 mmol/L (ref 3.5–5.2)
Sodium: 139 mmol/L (ref 134–144)
Total Protein: 6.7 g/dL (ref 6.0–8.5)
eGFR: 133 mL/min/{1.73_m2} (ref >59)

## 2022-08-05 LAB — CBC WITH DIFFERENTIAL/PLATELET
Basophils Absolute: 0 10*3/uL (ref 0.0–0.2)
Basos: 0 %
EOS (ABSOLUTE): 0.2 10*3/uL (ref 0.0–0.4)
Eos: 3 %
Hematocrit: 38.5 % (ref 34.0–46.6)
Hemoglobin: 12 g/dL (ref 11.1–15.9)
Immature Grans (Abs): 0 10*3/uL (ref 0.0–0.1)
Immature Granulocytes: 0 %
Lymphocytes Absolute: 2.7 10*3/uL (ref 0.7–3.1)
Lymphs: 34 %
MCH: 26.5 pg — ABNORMAL LOW (ref 26.6–33.0)
MCHC: 31.2 g/dL — ABNORMAL LOW (ref 31.5–35.7)
MCV: 85 fL (ref 79–97)
Monocytes Absolute: 0.4 10*3/uL (ref 0.1–0.9)
Monocytes: 5 %
Neutrophils Absolute: 4.4 10*3/uL (ref 1.4–7.0)
Neutrophils: 58 %
Platelets: 332 10*3/uL (ref 150–450)
RBC: 4.52 x10E6/uL (ref 3.77–5.28)
RDW: 12.8 % (ref 11.7–15.4)
WBC: 7.7 10*3/uL (ref 3.4–10.8)

## 2022-08-05 LAB — VITAMIN B12: Vitamin B-12: 765 pg/mL (ref 232–1245)

## 2022-08-05 LAB — TSH: TSH: 1.41 u[IU]/mL (ref 0.450–4.500)

## 2022-08-05 LAB — VITAMIN D 25 HYDROXY (VIT D DEFICIENCY, FRACTURES): Vit D, 25-Hydroxy: 37.2 ng/mL (ref 30.0–100.0)

## 2022-08-05 LAB — INSULIN, RANDOM: INSULIN: 4.8 u[IU]/mL (ref 2.6–24.9)

## 2022-08-05 LAB — HEMOGLOBIN A1C
Est. average glucose Bld gHb Est-mCnc: 108 mg/dL
Hgb A1c MFr Bld: 5.4 % (ref 4.8–5.6)

## 2022-08-18 ENCOUNTER — Telehealth (INDEPENDENT_AMBULATORY_CARE_PROVIDER_SITE_OTHER): Payer: BC Managed Care – PPO | Admitting: Psychology

## 2022-08-18 DIAGNOSIS — F5089 Other specified eating disorder: Secondary | ICD-10-CM | POA: Diagnosis not present

## 2022-08-18 NOTE — Progress Notes (Signed)
  Office: [phone]  /  Fax: [phone]    Date: Aug 18, 2022    Appointment Start Time: 11:03am Duration: 23 minutes Provider: Lawerance Cruel, Psy.D. Type of Session: Individual Therapy  Location of Patient: Home (private location) Location of Provider: Provider's Home (private office) Type of Contact: Telepsychological Visit via MyChart Video Visit  Session Content: Anna Holland is a 22 y.o. female presenting for a follow-up appointment to address the previously established treatment goal of increasing coping skills.Today's appointment was a telepsychological visit. Anna Holland provided verbal consent for today's telepsychological appointment and she is aware she is responsible for securing confidentiality on her end of the session. Prior to proceeding with today's appointment, Anna Holland's physical location at the time of this appointment was obtained as well a phone number she could be reached at in the event of technical difficulties. Anna Holland and this provider participated in today's telepsychological service.   This provider conducted a brief check-in. Anna Holland shared about recent events. Reviewed sleep hygiene. Anna Holland noted an improvement in sleep. Regarding eating habits, she disclosed "treating [herself]" during her graduation week. Notably, she stated she was mindful of her protein intake. Due to some challenges with social gatherings recently, she expressed desire to discuss strategies. Psychoeducation regarding making better choices and engaging in portion control during the holidays/celebrations/vacations was provided. More specifically, this provider discussed the following strategies: coming to meals hungry, but not starving; avoid filling up on appetizers; managing portion sizes; not completely depriving yourself; making the plate colorful (e.g., vegetables); pacing yourself (e.g., waiting 10 minutes before going back for seconds); taking advantage of the nutritious foods; practicing mindfulness;  staying hydrated; and avoid bringing home leftovers. Overall, Anna Holland was receptive to today's appointment as evidenced by openness to sharing, responsiveness to feedback, and willingness to implement discussed strategies .  Mental Status Examination:  Appearance: neat Behavior: appropriate to circumstances Mood: neutral Affect: mood congruent Speech: WNL Eye Contact: appropriate Psychomotor Activity: WNL Gait: unable to assess Thought Process: linear, logical, and goal directed and no evidence or endorsement of suicidal, homicidal, and self-harm ideation, plan and intent  Thought Content/Perception: no hallucinations, delusions, bizarre thinking or behavior endorsed or observed Orientation: AAOx4 Memory/Concentration: intact Insight: fair Judgment: fair  Interventions:  Conducted a brief chart review Provided empathic reflections and validation Provided positive reinforcement Employed motivational interviewing skills to assess patient's willingness/desire to adhere to recommended medical treatments and assignments Engaged patient in problem solving  DSM-5 Diagnosis(es): F50.89 Other Specified Feeding or Eating Disorder, Emotional Eating Behaviors  Treatment Goal & Progress: During the initial appointment with this provider, the following treatment goal was established: increase coping skills. Anna Holland has demonstrated progress in her goal as evidenced by increased awareness of hunger patterns. Anna Holland also continues to demonstrate willingness to engage in learned skill(s).  Plan: The next appointment is scheduled for 09/09/2022 at 2:30pm, which will be via MyChart Video Visit. The next session will focus on working towards the established treatment goal.

## 2022-08-28 ENCOUNTER — Other Ambulatory Visit: Payer: BC Managed Care – PPO

## 2022-08-28 ENCOUNTER — Other Ambulatory Visit: Payer: Self-pay

## 2022-08-28 DIAGNOSIS — Z111 Encounter for screening for respiratory tuberculosis: Secondary | ICD-10-CM

## 2022-09-01 LAB — QUANTIFERON-TB GOLD PLUS
Mitogen-NIL: 8.13 IU/mL
NIL: 0.02 IU/mL
QuantiFERON-TB Gold Plus: NEGATIVE
TB1-NIL: 0.01 IU/mL
TB2-NIL: 0 IU/mL

## 2022-09-02 ENCOUNTER — Ambulatory Visit (INDEPENDENT_AMBULATORY_CARE_PROVIDER_SITE_OTHER): Payer: BC Managed Care – PPO | Admitting: Physician Assistant

## 2022-09-02 ENCOUNTER — Encounter (INDEPENDENT_AMBULATORY_CARE_PROVIDER_SITE_OTHER): Payer: Self-pay | Admitting: Physician Assistant

## 2022-09-02 ENCOUNTER — Telehealth: Payer: Self-pay

## 2022-09-02 VITALS — BP 89/58 | HR 72 | Temp 98.4°F | Ht 61.0 in | Wt 206.0 lb

## 2022-09-02 DIAGNOSIS — Z6838 Body mass index (BMI) 38.0-38.9, adult: Secondary | ICD-10-CM

## 2022-09-02 DIAGNOSIS — Z6839 Body mass index (BMI) 39.0-39.9, adult: Secondary | ICD-10-CM

## 2022-09-02 DIAGNOSIS — R5383 Other fatigue: Secondary | ICD-10-CM | POA: Diagnosis not present

## 2022-09-02 DIAGNOSIS — E559 Vitamin D deficiency, unspecified: Secondary | ICD-10-CM

## 2022-09-02 DIAGNOSIS — E786 Lipoprotein deficiency: Secondary | ICD-10-CM

## 2022-09-02 DIAGNOSIS — E669 Obesity, unspecified: Secondary | ICD-10-CM

## 2022-09-02 DIAGNOSIS — E88819 Insulin resistance, unspecified: Secondary | ICD-10-CM

## 2022-09-02 DIAGNOSIS — E785 Hyperlipidemia, unspecified: Secondary | ICD-10-CM | POA: Diagnosis not present

## 2022-09-02 NOTE — Telephone Encounter (Signed)
-----   Message from Sheliah Hatch, MD sent at 09/02/2022  7:35 AM EDT ----- Negative TB test- great news!

## 2022-09-02 NOTE — Progress Notes (Signed)
.[facility]  Office: [phone]  /  Fax: [phone]  WEIGHT SUMMARY AND BIOMETRICS  Vitals Temp: 98.4 F (36.9 C) BP: (!) 89/58 Pulse Rate: 72 SpO2: 97 %   Anthropometric Measurements Height: 5\' 1"  (1.549 m) Weight: 206 lb (93.4 kg) BMI (Calculated): 38.94 Weight at Last Visit: 205 lb Weight Lost Since Last Visit: 0 lb Weight Gained Since Last Visit: 1 lb Starting Weight: 235 lb   Body Composition  Body Fat %: 44.7 % Fat Mass (lbs): 92.2 lbs Muscle Mass (lbs): 108.2 lbs Total Body Water (lbs): 83.2 lbs Visceral Fat Rating : 10   Other Clinical Data Fasting: No Labs: No Today's Visit #: 26 Starting Date: 12/20/20     HPI  Chief Complaint: OBESITY  Anna Holland is here to discuss her progress with her obesity treatment plan. She is on the the Category 1 Plan and states she is following her eating plan approximately 75 % of the time. She states she is exercising /gym60 minutes 1 times per week.   Interval History:  Since last office visit she is up 1 lb.   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   Goals over the next few weeks: 1) Increase gym to 2 x week           2) Focus on getting adequate protein           3) Increase overall movement- enjoys tennis with mom  Pharmacotherapy: None for weight loss.   TREATMENT PLAN FOR OBESITY:  Recommended Dietary Goals  Wednesday is currently in the action stage of change. As such, her goal is to continue weight management plan. She has agreed to the Category 1 Plan.  Behavioral Intervention  We  discussed the following Behavioral Modification Strategies today: increasing lean protein intake, decreasing simple carbohydrates , increasing vegetables, increasing lower glycemic fruits, increasing fiber rich foods, avoiding skipping meals, increasing water intake, continue to practice mindfulness when eating, and planning for success.  Additional resources provided today: NA  Recommended Physical Activity Goals  Nubia has been advised to work up to 150 minutes of moderate intensity aerobic activity a week and strengthening exercises 2-3 times per week for cardiovascular health, weight loss maintenance and preservation of muscle mass.   She has agreed to Continue current level of physical activity  and Start strengthening exercises with a goal of 2-3 sessions a week    Pharmacotherapy We discussed various medication options to help Sakile with her weight loss efforts and we both agreed to continue to work on nutritional and behavioral strategies to promote weight loss.      Return in about 3 weeks (around 09/23/2022).Marland Kitchen She was informed of the importance of frequent follow up visits to maximize her success with intensive lifestyle modifications for her multiple health conditions.  PHYSICAL EXAM:  Blood pressure (!) 89/58, pulse 72, temperature 98.4 F (36.9 C), height 5\' 1"  (1.549 m), weight 206 lb (93.4 kg), SpO2 97 %. Body mass  index is 38.92 kg/m.  General: She is overweight, cooperative, alert, well developed, and in no acute distress. PSYCH: Has normal mood, affect and thought process.   Cardiovascular: HR 70's regular, BP in low normal range as usual.  Lungs: Normal breathing effort, no conversational dyspnea. Neuro: no focal deficit  DIAGNOSTIC DATA REVIEWED:  BMET    Component Value Date/Time   NA 139 08/04/2022 0749   K 4.3 08/04/2022 0749   CL 104 08/04/2022 0749   CO2 21 08/04/2022 0749   GLUCOSE 78 08/04/2022 0749   GLUCOSE 117 (H) 03/10/2022 0054   BUN 9  08/04/2022 0749   CREATININE 0.54 (L) 08/04/2022 0749   CALCIUM 9.2 08/04/2022 0749   GFRNONAA >60 03/10/2022 0054   GFRAA NOT CALCULATED 12/29/2012 2056   Lab Results  Component Value Date   HGBA1C 5.4 08/04/2022   HGBA1C 5.4 12/20/2020   Lab Results  Component Value Date   INSULIN 4.8 08/04/2022   INSULIN 23.5 12/20/2020   Lab Results  Component Value Date   TSH 1.410 08/04/2022   CBC    Component Value Date/Time   WBC 7.7 08/04/2022 0749   WBC 13.4 (H) 03/10/2022 0054   RBC 4.52 08/04/2022 0749   RBC 4.55 03/10/2022 0054   HGB 12.0 08/04/2022 0749   HCT 38.5 08/04/2022 0749   PLT 332 08/04/2022 0749   MCV 85 08/04/2022 0749   MCH 26.5 (L) 08/04/2022 0749   MCH 27.0 03/10/2022 0054   MCHC 31.2 (L) 08/04/2022 0749   MCHC 33.2 03/10/2022 0054   RDW 12.8 08/04/2022 0749   Iron Studies No results found for: "IRON", "TIBC", "FERRITIN", "IRONPCTSAT" Lipid Panel     Component Value Date/Time   CHOL 144 08/04/2022 0749   TRIG 70 08/04/2022 0749   HDL 33 (L) 08/04/2022 0749   CHOLHDL 4.8 (H) 10/31/2021 0900   CHOLHDL 5 06/13/2020 0812   VLDL 23.2 06/13/2020 0812   LDLCALC 97 08/04/2022 0749   LDLDIRECT 126.0 09/02/2017 1452   Hepatic Function Panel     Component Value Date/Time   PROT 6.7 08/04/2022 0749   ALBUMIN 3.7 (L) 08/04/2022 0749   AST 15 08/04/2022 0749   ALT 15 08/04/2022 0749   ALKPHOS 92 08/04/2022 0749   BILITOT 0.3 08/04/2022 0749   BILIDIR 0.1 06/13/2020 0812      Component Value Date/Time   TSH 1.410 08/04/2022 0749   Nutritional Lab Results  Component Value Date   VD25OH 37.2 08/04/2022   VD25OH 64.0 02/20/2022   VD25OH 43.3 10/31/2021    ASSOCIATED CONDITIONS ADDRESSED TODAY  ASSESSMENT AND PLAN  Problem List Items Addressed This Visit     Obesity - Start BMI- 45.9   Other fatigue   Insulin resistance - Primary   Vitamin D deficiency   BMI 39.0-39.9,adult   Hyperlipidemia with low HDL   Insulin Resistance Last fasting  insulin was 4.8. A1c was 5.4- both at goals. Polyphagia:No Medication(s): None She is working on nutrition plan to decrease simple carbohydrates, increase lean proteins and exercise to promote weight loss, improve glycemic control and prevent progression to Type 2 diabetes.   Lab Results  Component Value Date   HGBA1C 5.4 08/04/2022   HGBA1C 5.2 02/20/2022   HGBA1C 5.1 10/31/2021   HGBA1C 5.4 04/17/2021   HGBA1C 5.4 12/20/2020   Lab Results  Component Value Date   INSULIN 4.8 08/04/2022   INSULIN 8.3 02/20/2022   INSULIN 11.2 10/31/2021   INSULIN 9.0 04/17/2021   INSULIN  23.5 12/20/2020    Plan:  Continue working on nutrition plan to decrease simple carbohydrates, increase lean proteins and exercise to promote weight loss, improve glycemic control and prevent progression to Type 2 diabetes.    Dyslipidemia with Low HDL LDL is at goal. Triglycerides 70- at goal. HDL 33- not at goal of > 40.  Medication(s): None Cardiovascular risk factors: dyslipidemia  Lab Results  Component Value Date   CHOL 144 08/04/2022   HDL 33 (L) 08/04/2022   LDLCALC 97 08/04/2022   LDLDIRECT 126.0 09/02/2017   TRIG 70 08/04/2022   CHOLHDL 4.8 (H) 10/31/2021   CHOLHDL 5.3 (H) 04/17/2021   CHOLHDL 5 06/13/2020   Lab Results  Component Value Date   ALT 15 08/04/2022   AST 15 08/04/2022   ALKPHOS 92 08/04/2022   BILITOT 0.3 08/04/2022   The ASCVD Risk score (Arnett DK, et al., 2019) failed to calculate for the following reasons:   The 2019 ASCVD risk score is only valid for ages 65 to 49  Plan: She is working on getting back into gym and on her nutrition plan to promote weight loss and improve overall health.  Continue to work on nutrition plan -decreasing simple carbohydrates, increasing lean proteins, decreasing saturated fats and cholesterol , avoiding trans fats and exercise as able to promote weight loss, improve lipids and decrease cardiovascular risks.   Vitamin D  Deficiency Vitamin D is not at goal of 50.  Most recent vitamin D level was 37.2. She is on  prescription ergocalciferol 50,000 IU every 14 days. No side effects with Ergocalciferol, but not consistently taking.  Lab Results  Component Value Date   VD25OH 37.2 08/04/2022   VD25OH 64.0 02/20/2022   VD25OH 43.3 10/31/2021    Plan: Continue  prescription ergocalciferol 50,000 IU every 14 days Reports no refill needed as forgets to take consistently and is working on taking as ordered. Low vitamin D levels can be associated with adiposity and may result in leptin resistance and weight gain. Also associated with fatigue. Currently on vitamin D supplementation without any adverse effects.     Fatigue:  Endorses fatigue. Taking mult-vitamin, Vitamin D but not consistently . Hgb/Hct are normal range, but mildly microcytic indices. She reports cycles are not regular or very heavy. She is going to see GYN soon to have IUD replaced. Recheck of B 12 - 765- normal range, Vit D 37.3 but not always remembering to take Vitamin D. Started multi-vitamin.   Plan: Hadden does feel  her energy to be lower than it should be. Fatigue may be related to obesity, depression or many other causes. Tyquesha will focus on self care including making healthy food choices, increasing physical activity and focusing on stress reduction.Continue multiple vitamin and Ergocalciferol 50K IU every 14 days and monitor closely.   ATTESTASTION STATEMENTS:  Reviewed by clinician on day of visit: allergies, medications, problem list, medical history, surgical history, family history, social history, and previous encounter notes.   I have personally spent 40 minutes total time today in preparation, patient care, nutritional counseling and documentation for this visit, including the following: review of clinical lab tests; review of medical tests/procedures/services.      Toa Mia, PA-C

## 2022-09-02 NOTE — Telephone Encounter (Signed)
Left results Via VM

## 2022-09-09 ENCOUNTER — Telehealth (INDEPENDENT_AMBULATORY_CARE_PROVIDER_SITE_OTHER): Payer: BC Managed Care – PPO | Admitting: Psychology

## 2022-09-09 DIAGNOSIS — F5089 Other specified eating disorder: Secondary | ICD-10-CM

## 2022-09-09 NOTE — Progress Notes (Signed)
  Office: [phone]  /  Fax: [phone]    Date: September 09, 2022    Appointment Start Time: 2:31pm Duration: 24 minutes Provider: Lawerance Cruel, Psy.D. Type of Session: Individual Therapy  Location of Patient: Home (private location) Location of Provider: Provider's Home (private office) Type of Contact: Telepsychological Visit via MyChart Video Visit  Session Content: Anna Holland is a 22 y.o. female presenting for a follow-up appointment to address the previously established treatment goal of increasing coping skills.Today's appointment was a telepsychological visit. Anna Holland provided verbal consent for today's telepsychological appointment and she is aware she is responsible for securing confidentiality on her end of the session. Prior to proceeding with today's appointment, Anna Holland's physical location at the time of this appointment was obtained as well a phone number she could be reached at in the event of technical difficulties. Anna Holland and this provider participated in today's telepsychological service.   This provider conducted a brief check-in. 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 Regarding eating habits, she stated she will snack while she studies at night but tries to choose something from her prescribed meal plan. Psychoeducation regarding triggers for emotional eating was provided. Anna Holland was provided a handout, and encouraged to utilize the handout between now and the next appointment to increase awareness of triggers and frequency. Anna Holland agreed. This provider also discussed behavioral strategies for specific triggers, such as placing the utensil down when conversing to avoid mindless eating. Anna Holland provided verbal consent during today's appointment for this provider to send a handout about triggers via e-mail. Overall, Anna Holland was receptive to today's appointment as evidenced by openness to sharing, responsiveness to feedback, and  willingness to explore triggers for emotional eating.  Mental Status Examination:  Appearance: neat Behavior: appropriate to circumstances Mood: neutral Affect: mood congruent Speech: WNL Eye Contact: appropriate Psychomotor Activity: WNL Gait: unable to assess Thought Process: linear, logical, and goal directed and no evidence or endorsement of suicidal, homicidal, and self-harm ideation, plan and intent  Thought Content/Perception: no hallucinations, delusions, bizarre thinking or behavior endorsed or observed Orientation: AAOx4 Memory/Concentration: memory, attention, language, and fund of knowledge intact  Insight: fair Judgment: fair  Interventions:  Conducted a brief chart review Provided empathic reflections and validation Provided positive reinforcement Employed supportive psychotherapy interventions to facilitate reduced distress and to improve coping skills with identified stressors Psychoeducation provided regarding triggers for emotional eating behaviors  DSM-5 Diagnosis(es): F50.89 Other Specified Feeding or Eating Disorder, Emotional Eating Behaviors  Treatment Goal & Progress: During the initial appointment with this provider, the following treatment goal was established: increase coping skills. Anna Holland has demonstrated progress in her goal as evidenced by increased awareness of hunger patterns. Anna Holland also continues to demonstrate willingness to engage in learned skill(s).  Plan: Based on appointment availability and Anna Holland's upcoming vacation, the next appointment is scheduled for 10/14/2022 at 2:30pm, which will be via MyChart Video Visit. The next session will focus on working towards the established treatment goal.

## 2022-09-25 ENCOUNTER — Ambulatory Visit (INDEPENDENT_AMBULATORY_CARE_PROVIDER_SITE_OTHER): Payer: BC Managed Care – PPO | Admitting: Physician Assistant

## 2022-09-25 ENCOUNTER — Encounter (INDEPENDENT_AMBULATORY_CARE_PROVIDER_SITE_OTHER): Payer: Self-pay | Admitting: Physician Assistant

## 2022-09-25 VITALS — BP 107/69 | HR 83 | Temp 98.2°F | Ht 61.0 in | Wt 201.0 lb

## 2022-09-25 DIAGNOSIS — F5089 Other specified eating disorder: Secondary | ICD-10-CM

## 2022-09-25 DIAGNOSIS — E669 Obesity, unspecified: Secondary | ICD-10-CM

## 2022-09-25 DIAGNOSIS — Z6838 Body mass index (BMI) 38.0-38.9, adult: Secondary | ICD-10-CM

## 2022-09-25 DIAGNOSIS — E88819 Insulin resistance, unspecified: Secondary | ICD-10-CM | POA: Diagnosis not present

## 2022-09-25 DIAGNOSIS — Z6837 Body mass index (BMI) 37.0-37.9, adult: Secondary | ICD-10-CM

## 2022-09-25 DIAGNOSIS — E559 Vitamin D deficiency, unspecified: Secondary | ICD-10-CM | POA: Diagnosis not present

## 2022-09-25 MED ORDER — VITAMIN D (ERGOCALCIFEROL) 1.25 MG (50000 UNIT) PO CAPS: 50000.0000 [IU] | ORAL_CAPSULE | ORAL | 0 refills | Status: DC

## 2022-09-25 NOTE — Progress Notes (Signed)
.smr  Office: [phone]  /  Fax: [phone]  WEIGHT SUMMARY AND BIOMETRICS  Vitals Temp: 98.2 F (36.8 C) BP: 107/69 Pulse Rate: 83 SpO2: 96 %   Anthropometric Measurements Height: 5\' 1"  (1.549 m) Weight: 201 lb (91.2 kg) BMI (Calculated): 38 Weight at Last Visit: 206 lb Weight Lost Since Last Visit: 5 lb Weight Gained Since Last Visit: 0 lb Starting Weight: 235 lb   Body Composition  Body Fat %: 43.5 % Fat Mass (lbs): 87.8 lbs Muscle Mass (lbs): 108.2 lbs Total Body Water (lbs): 81.4 lbs Visceral Fat Rating : 9   Other Clinical Data Fasting: No Labs: No Today's Visit #: 27 Starting Date: 12/20/20     HPI  Chief Complaint: OBESITY  Anna Holland is here to discuss her progress with her obesity treatment plan. She is on the the Category 1 Plan and states she is following her eating plan approximately 75-80 % of the time. She states she is exercising walking dog and going back to gym 20 minutes 2-3 times per week.   Interval History:  Since last office visit she is down 5 lbs.   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  Sleep- Better now as hours working/studying are decreased.  Exercise-Going back to gym and working to make this a regular thing  Has travel plans for summer: [place] for few days     [place] week     [place] few days     May go to [place], but not sure yet Discussed travel strategies.  Discussed getting adequate prebiotic fiber- At least 3 servings of fruit or vegetables daily.    Pharmacotherapy: None for weight loss  TREATMENT PLAN FOR OBESITY:  Recommended Dietary Goals  Mylin is currently in the action stage of change. As such, her goal is to continue weight management plan. She has agreed to the Category 1 Plan.  Behavioral Intervention  We discussed the following  Behavioral Modification Strategies today: increasing lean protein intake, decreasing simple carbohydrates , increasing vegetables, increasing lower glycemic fruits, increasing fiber rich foods, increasing water intake, continue to practice mindfulness when eating, planning for success, and staying on track while traveling and vacationing.  Additional resources provided today: NA  Recommended Physical Activity Goals  Alisa has been advised to work up to 150 minutes of moderate intensity aerobic activity a week and strengthening exercises 2-3 times per week for cardiovascular health, weight loss maintenance and preservation of muscle mass.   She has agreed to Continue current level of physical activity  and Increase physical activity in their day and reduce sedentary time (increase NEAT).   Pharmacotherapy We discussed various medication options to help Shadai with her weight loss efforts and we both agreed to continue to work on nutritional and behavioral strategies to promote weight loss.    Return in about 5 weeks (around 10/30/2022).Marland Kitchen She was informed of the importance of frequent follow up visits to maximize her success with intensive lifestyle modifications for her multiple health conditions.  PHYSICAL EXAM:  Blood pressure 107/69, pulse 83, temperature 98.2 F (36.8 C), height 5\' 1"  (1.549 m), weight 201 lb (91.2 kg), SpO2 96 %. Body mass index is 37.98 kg/m.  General: She is overweight, cooperative, alert, well developed, and in no acute distress. PSYCH: Has normal mood, affect and thought process.   Cardiovascular: HR 80's regular, BP 107/69  Lungs: Normal breathing effort, no conversational dyspnea. Neuro: no focal deficits  DIAGNOSTIC DATA REVIEWED:  BMET    Component Value Date/Time   NA 139 08/04/2022 0749   K 4.3 08/04/2022 0749   CL 104 08/04/2022 0749   CO2 21 08/04/2022 0749   GLUCOSE 78 08/04/2022 0749   GLUCOSE 117 (H) 03/10/2022 0054   BUN 9 08/04/2022 0749    CREATININE 0.54 (L) 08/04/2022 0749   CALCIUM 9.2 08/04/2022 0749   GFRNONAA >60 03/10/2022 0054   GFRAA NOT CALCULATED 12/29/2012 2056   Lab Results  Component Value Date   HGBA1C 5.4 08/04/2022   HGBA1C 5.4 12/20/2020   Lab Results  Component Value Date   INSULIN 4.8 08/04/2022   INSULIN 23.5 12/20/2020   Lab Results  Component Value Date   TSH 1.410 08/04/2022   CBC    Component Value Date/Time   WBC 7.7 08/04/2022 0749   WBC 13.4 (H) 03/10/2022 0054   RBC 4.52 08/04/2022 0749   RBC 4.55 03/10/2022 0054   HGB 12.0 08/04/2022 0749   HCT 38.5 08/04/2022 0749   PLT 332 08/04/2022 0749   MCV 85 08/04/2022 0749   MCH 26.5 (L) 08/04/2022 0749   MCH 27.0 03/10/2022 0054   MCHC 31.2 (L) 08/04/2022 0749   MCHC 33.2 03/10/2022 0054   RDW 12.8 08/04/2022 0749   Iron Studies No results found for: "IRON", "TIBC", "FERRITIN", "IRONPCTSAT" Lipid Panel     Component Value Date/Time   CHOL 144 08/04/2022 0749   TRIG 70 08/04/2022 0749   HDL 33 (L) 08/04/2022 0749   CHOLHDL 4.8 (H) 10/31/2021 0900   CHOLHDL 5 06/13/2020 0812   VLDL 23.2 06/13/2020 0812   LDLCALC 97 08/04/2022 0749   LDLDIRECT 126.0 09/02/2017 1452   Hepatic Function Panel     Component Value Date/Time   PROT 6.7 08/04/2022 0749   ALBUMIN 3.7 (L) 08/04/2022 0749   AST 15 08/04/2022 0749   ALT 15 08/04/2022 0749   ALKPHOS 92 08/04/2022 0749   BILITOT 0.3 08/04/2022 0749   BILIDIR 0.1 06/13/2020 0812      Component Value Date/Time   TSH 1.410 08/04/2022 0749   Nutritional Lab Results  Component Value Date   VD25OH 37.2 08/04/2022   VD25OH 64.0 02/20/2022   VD25OH 43.3 10/31/2021    ASSOCIATED CONDITIONS ADDRESSED TODAY  ASSESSMENT AND PLAN  Problem List Items Addressed This Visit     Obesity - Start BMI- 45.9   Insulin resistance - Primary   Vitamin D deficiency   Relevant Medications   Vitamin D, Ergocalciferol, (DRISDOL) 1.25 MG (50000 UNIT) CAPS capsule   BMI 38.0-38.9,adult    Other Specified Feeding or Eating Disorder, Emotional Eating Behaviors  Insulin Resistance Last fasting insulin was 4.8. A1c was 5.4- at goals. Polyphagia:No Medication(s): None She is working on nutrition plan to decrease simple carbohydrates, increase lean proteins and exercise to promote weight loss, improve insulin resistance/ glycemic control and prevent progression to Type 2 diabetes.   Lab Results  Component Value Date   HGBA1C 5.4 08/04/2022   HGBA1C 5.2 02/20/2022   HGBA1C 5.1 10/31/2021   HGBA1C 5.4 04/17/2021   HGBA1C 5.4 12/20/2020   Lab Results  Component Value Date   INSULIN 4.8 08/04/2022   INSULIN 8.3 02/20/2022   INSULIN 11.2 10/31/2021   INSULIN 9.0 04/17/2021   INSULIN 23.5 12/20/2020    Plan:  Continue working on nutrition plan to decrease simple carbohydrates, increase lean proteins and exercise to promote  weight loss, improve insulin resistance/ glycemic control and prevent progression to Type 2 diabetes.   Vitamin D Deficiency Vitamin D is not at goal of 50.  Most recent vitamin D level was 37.2. She is on  prescription ergocalciferol 50,000 IU every 14 days. No side effects with Ergocalciferol.  Reports she has not been taking consistently. Previously level was good when remembering to take medication.  Lab Results  Component Value Date   VD25OH 37.2 08/04/2022   VD25OH 64.0 02/20/2022   VD25OH 43.3 10/31/2021    Plan: Continue and refill  prescription ergocalciferol 50,000 IU every 14 days Low vitamin D levels can be associated with adiposity and may result in leptin resistance and weight gain. Also associated with fatigue. Currently on vitamin D supplementation without any adverse effects.  Plan to recheck vitamin D level in 3-4 months.   Eating disorder/emotional eating Manroop has had issues with stress/emotional eating. Currently this is moderately controlled. Overall mood is stable. Medication(s): Bupropion SR 150 mg daily in am and Other:  Celexa 20 mg daily per PCP- Dr. Beverely Low.  No side effects with medications.   Plan: Continue Bupropion SR 150 mg daily in am and Other: Celexa 20 mg daily per PCP  and monitor response . She is working with Dr. Dewaine Conger on emotional eating strategies as well.    ATTESTASTION STATEMENTS:  Reviewed by clinician on day of visit: allergies, medications, problem list, medical history, surgical history, family history, social history, and previous encounter notes.   I have personally spent 30 minutes total time today in preparation, patient care, nutritional counseling and documentation for this visit, including the following: review of clinical lab tests; review of medical tests/procedures/services.      Baley Shands, PA-C

## 2022-09-26 DIAGNOSIS — F5089 Other specified eating disorder: Secondary | ICD-10-CM | POA: Insufficient documentation

## 2022-10-14 ENCOUNTER — Telehealth (INDEPENDENT_AMBULATORY_CARE_PROVIDER_SITE_OTHER): Payer: BC Managed Care – PPO | Admitting: Psychology

## 2022-10-14 DIAGNOSIS — F5089 Other specified eating disorder: Secondary | ICD-10-CM

## 2022-10-14 NOTE — Progress Notes (Signed)
  Office: [phone]  /  Fax: [phone]    Date: October 14, 2022  Appointment Start Time: 2:29pm Duration: 21 minutes Provider: Lawerance Cruel, Psy.D. Type of Session: Individual Therapy  Location of Patient:  Boyfriend's home  (address obtained; safe/private location) Location of Provider: Provider's Home (private office) Type of Contact: Telepsychological Visit via MyChart Video Visit  Session Content: Anna Holland is a 22 y.o. female presenting for a follow-up appointment to address the previously established treatment goal of increasing coping skills.Today's appointment was a telepsychological visit. Anna Holland provided verbal consent for today's telepsychological appointment and she is aware she is responsible for securing confidentiality on her end of the session. Prior to proceeding with today's appointment, Anna Holland's physical location at the time of this appointment was obtained as well a phone number she could be reached at in the event of technical difficulties. Anna Holland and this provider participated in today's telepsychological service.   This provider conducted a brief check-in. Anna Holland shared she passed her exam and recently went on vacation. She shared eating is "going alright." Anna Holland discussed making better choices and engaging in portion control during her vacation. Psychoeducation provided regarding mindful eating (sit down, slowly chewing, savor, simplify, and smile). Overall, Anna Holland was receptive to today's appointment as evidenced by openness to sharing, responsiveness to feedback, and willingness to implement discussed strategies .  Mental Status Examination:  Appearance: neat Behavior: appropriate to circumstances Mood: neutral Affect: mood congruent Speech: WNL Eye Contact: appropriate Psychomotor Activity: WNL Gait: unable to assess Thought Process: linear, logical, and goal directed and no evidence or endorsement of suicidal, homicidal, and self-harm ideation, plan and intent   Thought Content/Perception: no hallucinations, delusions, bizarre thinking or behavior endorsed or observed Orientation: AAOx4 Memory/Concentration: intact Insight: fair Judgment: fair  Interventions:  Conducted a brief chart review Provided empathic reflections and validation Provided positive reinforcement Employed supportive psychotherapy interventions to facilitate reduced distress and to improve coping skills with identified stressors Psychoeducation provided regarding mindful eating  DSM-5 Diagnosis(es): F50.89 Other Specified Feeding or Eating Disorder, Emotional Eating Behaviors  Treatment Goal & Progress: During the initial appointment with this provider, the following treatment goal was established: increase coping skills. Anna Holland has demonstrated progress in her goal as evidenced by increased awareness of hunger patterns and increased awareness of triggers for emotional eating behaviors. Anna Holland also continues to demonstrate willingness to engage in learned skill(s).  Plan: Anna Holland will be starting a new job and is uncertain about her work schedule. As such, she noted a plan to call the clinic to schedule a follow-up appointment. She was receptive to this provider's clinic calling in her in 1-2 weeks if she has not called to schedule.

## 2022-10-27 ENCOUNTER — Ambulatory Visit: Payer: BC Managed Care – PPO | Admitting: Family Medicine

## 2022-10-28 ENCOUNTER — Encounter (INDEPENDENT_AMBULATORY_CARE_PROVIDER_SITE_OTHER): Payer: Self-pay | Admitting: Adult Health

## 2022-10-28 ENCOUNTER — Ambulatory Visit (INDEPENDENT_AMBULATORY_CARE_PROVIDER_SITE_OTHER): Payer: BC Managed Care – PPO | Admitting: Adult Health

## 2022-10-28 ENCOUNTER — Telehealth (INDEPENDENT_AMBULATORY_CARE_PROVIDER_SITE_OTHER): Payer: Self-pay | Admitting: Psychology

## 2022-10-28 ENCOUNTER — Ambulatory Visit (INDEPENDENT_AMBULATORY_CARE_PROVIDER_SITE_OTHER): Payer: BC Managed Care – PPO | Admitting: Physician Assistant

## 2022-10-28 VITALS — BP 95/59 | HR 77 | Temp 98.4°F | Ht 61.0 in | Wt 206.0 lb

## 2022-10-28 DIAGNOSIS — E559 Vitamin D deficiency, unspecified: Secondary | ICD-10-CM | POA: Diagnosis not present

## 2022-10-28 DIAGNOSIS — Z6838 Body mass index (BMI) 38.0-38.9, adult: Secondary | ICD-10-CM | POA: Diagnosis not present

## 2022-10-28 DIAGNOSIS — E88819 Insulin resistance, unspecified: Secondary | ICD-10-CM | POA: Diagnosis not present

## 2022-10-28 DIAGNOSIS — E669 Obesity, unspecified: Secondary | ICD-10-CM

## 2022-10-28 NOTE — Progress Notes (Signed)
WEIGHT SUMMARY AND BIOMETRICS  Vitals Temp: 98.4 F (36.9 C) BP: (!) 95/59 Pulse Rate: 77 SpO2: 98 %   Anthropometric Measurements Height: 5\' 1"  (1.549 m) Weight: 206 lb (93.4 kg) BMI (Calculated): 38.94 Weight at Last Visit: 201lb Weight Lost Since Last Visit: 0 Weight Gained Since Last Visit: 5lb Starting Weight: 235lb Total Weight Loss (lbs): 29 lb (13.2 kg)   Body Composition  Body Fat %: 44.6 % Fat Mass (lbs): 91.8 lbs Muscle Mass (lbs): 108.4 lbs Total Body Water (lbs): 83 lbs Visceral Fat Rating : 10   Other Clinical Data Fasting: no Labs: no Today's Visit #: 29 Starting Date: 12/20/20    Chief Complaint:   OBESITY Anna Holland is here to discuss her progress with her obesity treatment plan. She is on the the Category 1 Plan and states she is following her eating plan approximately 70 % of the time. She states she is not currently exercising.   Interim History:  Anna Holland graduated from Nursing School- [employer] She just passed Environmental consultant- [employer]GR[employer]ULATIONS!  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 Vitamin D deficiency  Latest Reference Range & Units 02/20/22 11:23 08/04/22 07:49  Vitamin D, 25-Hydroxy 30.0 - 100.0 ng/mL 64.0 37.2   She is on weekly Ergocalciferol- denies N/V/Muscle Weakness  2. Insulin resistance  Latest Reference Range & Units 08/04/22 07:49  Glucose 70 - 99 mg/dL 78  Hemoglobin W0J 4.8 - 5.6 % 5.4  Est. average glucose Bld gHb Est-mCnc mg/dL 811  INSULIN 2.6 - 91.4 uIU/mL 4.8    Latest Reference Range & Units 10/31/21 09:00 02/20/22 11:23 08/04/22 07:49  INSULIN 2.6 - 24.9 uIU/mL 11.2  8.3 4.8   She denies polyphagia She is not currently on any antidiabetic medications  Assessment/Plan:   1. Vitamin D deficiency Continue weekly Ergocalciferol  2. Insulin resistance Increase protein and limit sugar/CHO Increase cardiovascular exercise  3. BMI 38.0-38.9,adult Current BMI 45.90  Anna Holland is currently in the action stage of change. As such, her goal is to continue with weight loss efforts. She has agreed to the Category 1 Plan.   Exercise goals: For substantial health benefits, adults should do at least 150 minutes (2 hours and 30 minutes) a week of moderate-intensity, or 75 minutes (1 hour and 15 minutes) a week of vigorous-intensity aerobic physical activity, or an equivalent combination of moderate- and vigorous-intensity aerobic activity. Aerobic activity should be performed in episodes of at least 10 minutes, and preferably, it should be spread throughout the week.  Behavioral modification strategies: increasing lean protein intake, decreasing simple carbohydrates, increasing vegetables, increasing water intake, meal planning and cooking strategies, keeping healthy foods in the home, and planning for success.  Anna Holland has agreed to follow-up with our clinic in 4 weeks. She was informed of the importance of frequent follow-up visits to maximize her success with intensive lifestyle modifications for her multiple health conditions.   Objective:   Blood pressure (!) 95/59, pulse 77, temperature 98.4 F (36.9 C), height 5\' 1"  (1.549 m), weight 206  lb (93.4 kg), SpO2 98%. Body mass index is 38.92 kg/m.  General: Cooperative, alert, well developed, in no acute distress. HEENT: Conjunctivae and lids unremarkable. Cardiovascular: Regular rhythm.  Lungs: Normal work of breathing. Neurologic: No focal deficits.   Lab Results  Component Value Date   CREATININE 0.54 (L) 08/04/2022   BUN 9 08/04/2022   NA 139 08/04/2022   K 4.3 08/04/2022   CL 104 08/04/2022   CO2 21  08/04/2022   Lab Results  Component Value Date   ALT 15 08/04/2022   AST 15 08/04/2022   ALKPHOS 92 08/04/2022   BILITOT 0.3 08/04/2022   Lab Results  Component Value Date   HGBA1C 5.4 08/04/2022   HGBA1C 5.2 02/20/2022   HGBA1C 5.1 10/31/2021   HGBA1C 5.4 04/17/2021   HGBA1C 5.4 12/20/2020   Lab Results  Component Value Date   INSULIN 4.8 08/04/2022   INSULIN 8.3 02/20/2022   INSULIN 11.2 10/31/2021   INSULIN 9.0 04/17/2021   INSULIN 23.5 12/20/2020   Lab Results  Component Value Date   TSH 1.410 08/04/2022   Lab Results  Component Value Date   CHOL 144 08/04/2022   HDL 33 (L) 08/04/2022   LDLCALC 97 08/04/2022   LDLDIRECT 126.0 09/02/2017   TRIG 70 08/04/2022   CHOLHDL 4.8 (H) 10/31/2021   Lab Results  Component Value Date   VD25OH 37.2 08/04/2022   VD25OH 64.0 02/20/2022   VD25OH 43.3 10/31/2021   Lab Results  Component Value Date   WBC 7.7 08/04/2022   HGB 12.0 08/04/2022   HCT 38.5 08/04/2022   MCV 85 08/04/2022   PLT 332 08/04/2022   No results found for: "IRON", "TIBC", "FERRITIN"  Attestation Statements:   Reviewed by clinician on day of visit: allergies, medications, problem list, medical history, surgical history, family history, social history, and previous encounter notes.  I have reviewed the above documentation for accuracy and completeness, and I agree with the above. -  Rilie Glanz d. Rasheeda Mulvehill, NP-C

## 2022-10-28 NOTE — Telephone Encounter (Signed)
  Office: [phone]  /  Fax: [phone]  Date of Call: October 28, 2022  Time of Call: 10:24am Duration of Call: ~1 minute(s) Provider: Lawerance Cruel, PsyD  CONTENT:  This provider called Porfirio Mylar to check-in and schedule a follow-up appointment. She indicated she still does not have her schedule for her new job, but also noted a desire to cease services at this time. She noted, "I actually think I'm good right now." She acknowledged understanding that she may request a follow-up appointment with this provider in the future as long as she is still established with the clinic.    PLAN:  No further follow-up planned by this provider.

## 2022-11-20 NOTE — Progress Notes (Signed)
.smr  Office: [phone]  /  Fax: [phone]  WEIGHT SUMMARY AND BIOMETRICS  Vitals Temp: 98 F (36.7 C) BP: (!) 92/55 Pulse Rate: 66 SpO2: 99 %   Anthropometric Measurements Height: 5\' 1"  (1.549 m) Weight: 204 lb (92.5 kg) BMI (Calculated): 38.57 Weight at Last Visit: 206lb Weight Lost Since Last Visit: 2lb Weight Gained Since Last Visit: 0 Starting Weight: 235lb Total Weight Loss (lbs): 31 lb (14.1 kg)   Body Composition  Body Fat %: 41.3 % Fat Mass (lbs): 84.4 lbs Muscle Mass (lbs): 114 lbs Total Body Water (lbs): 84 lbs Visceral Fat Rating : 9   Other Clinical Data Fasting: no Labs: no Today's Visit #: 30 Starting Date: 12/20/20     HPI  Chief Complaint: OBESITY  Anna Holland is here to discuss her progress with her obesity treatment plan. She is on the the Category 1 Plan and states she is following her eating plan approximately 70 % of the time. She states she is exercising Cardio 30 minutes 1-2 times per week.   Interval History:  Since last office visit she Down 2 lbs.  Recovered from recent URI  Bio impedence scale was reviewed with the patient: Up 5.6 lbs muscle mass Down 7.4 lbs adipose mass Up 1 lb total body water Visceral adipose down to 9!   Enjoyed traveling over the summer.  Has moved in with boyfriend and they are working out together and he is supportive of nutrition plan.  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 Then will be working nights in ER.   Non fasting labs obtained today.  She was informed we would discuss her lab results at her next visit unless there is a critical issue that needs to be addressed sooner. She agreed to keep her next visit at the agreed upon time to discuss these results.   Pharmacotherapy: None for weight loss.   TREATMENT PLAN FOR OBESITY:  Recommended Dietary Goals  Anna Holland is currently in the action stage of change. As such, her goal is to  continue weight management plan. She has agreed to the Category 1 Plan.  Behavioral Intervention  We discussed the following Behavioral Modification Strategies today: increasing lean protein intake, decreasing simple carbohydrates , increasing vegetables, increasing lower glycemic fruits, avoiding skipping meals, increasing water intake, emotional eating strategies and understanding the difference between hunger signals and cravings, work on managing stress, creating time for self-care and relaxation measures, continue to practice mindfulness when eating, and planning for success.  Additional resources provided today: NA  Recommended Physical Activity Goals  Anna Holland has been advised to work up to 150 minutes of moderate intensity aerobic activity a week and strengthening exercises 2-3 times per week for cardiovascular health, weight loss maintenance and preservation of muscle mass.   She has agreed to Continue current level of physical activity    Pharmacotherapy We discussed various medication options to help Moxie with her weight loss efforts and we both agreed to continue to work on nutritional and behavioral strategies to promote weight loss.     Return in about 4 weeks (around 12/22/2022).Marland Kitchen She was informed of the importance of frequent follow up visits to maximize her success with intensive lifestyle modifications for her multiple health conditions.  PHYSICAL EXAM:  Blood pressure (!) 92/55, pulse 66, temperature 98 F (36.7 C), height 5\' 1"  (1.549 m), weight 204 lb (92.5 kg), SpO2 99%. Body mass index  is 38.55 kg/m.  General: She is overweight, cooperative, alert, well developed, and in no acute distress. PSYCH: Has normal mood, affect and thought process.   Cardiovascular: HR 60's BP 92/55 Lungs: Normal breathing effort, no conversational dyspnea. Neuro: no focal deficits  DIAGNOSTIC DATA REVIEWED:  BMET    Component Value Date/Time   NA 139 08/04/2022 0749   K 4.3  08/04/2022 0749   CL 104 08/04/2022 0749   CO2 21 08/04/2022 0749   GLUCOSE 78 08/04/2022 0749   GLUCOSE 117 (H) 03/10/2022 0054   BUN 9 08/04/2022 0749   CREATININE 0.54 (L) 08/04/2022 0749   CALCIUM 9.2 08/04/2022 0749   GFRNONAA >60 03/10/2022 0054   GFRAA NOT CALCULATED 12/29/2012 2056   Lab Results  Component Value Date   HGBA1C 5.4 08/04/2022   HGBA1C 5.4 12/20/2020   Lab Results  Component Value Date   INSULIN 4.8 08/04/2022   INSULIN 23.5 12/20/2020   Lab Results  Component Value Date   TSH 1.410 08/04/2022   CBC    Component Value Date/Time   WBC 7.7 08/04/2022 0749   WBC 13.4 (H) 03/10/2022 0054   RBC 4.52 08/04/2022 0749   RBC 4.55 03/10/2022 0054   HGB 12.0 08/04/2022 0749   HCT 38.5 08/04/2022 0749   PLT 332 08/04/2022 0749   MCV 85 08/04/2022 0749   MCH 26.5 (L) 08/04/2022 0749   MCH 27.0 03/10/2022 0054   MCHC 31.2 (L) 08/04/2022 0749   MCHC 33.2 03/10/2022 0054   RDW 12.8 08/04/2022 0749   Iron Studies No results found for: "IRON", "TIBC", "FERRITIN", "IRONPCTSAT" Lipid Panel     Component Value Date/Time   CHOL 144 08/04/2022 0749   TRIG 70 08/04/2022 0749   HDL 33 (L) 08/04/2022 0749   CHOLHDL 4.8 (H) 10/31/2021 0900   CHOLHDL 5 06/13/2020 0812   VLDL 23.2 06/13/2020 0812   LDLCALC 97 08/04/2022 0749   LDLDIRECT 126.0 09/02/2017 1452   Hepatic Function Panel     Component Value Date/Time   PROT 6.7 08/04/2022 0749   ALBUMIN 3.7 (L) 08/04/2022 0749   AST 15 08/04/2022 0749   ALT 15 08/04/2022 0749   ALKPHOS 92 08/04/2022 0749   BILITOT 0.3 08/04/2022 0749   BILIDIR 0.1 06/13/2020 0812      Component Value Date/Time   TSH 1.410 08/04/2022 0749   Nutritional Lab Results  Component Value Date   VD25OH 37.2 08/04/2022   VD25OH 64.0 02/20/2022   VD25OH 43.3 10/31/2021    ASSOCIATED CONDITIONS ADDRESSED TODAY  ASSESSMENT AND PLAN  Problem List Items Addressed This Visit     Obesity - Start BMI- 45.9   Insulin resistance    Relevant Orders   CMP14+EGFR   Vitamin D deficiency - Primary   Relevant Orders   VITAMIN D 25 Hydroxy (Vit-D Deficiency, Fractures)   Other Specified Feeding or Eating Disorder, Emotional Eating Behaviors   Other Visit Diagnoses     Abnormal RBC indices       Relevant Orders   CBC with Differential/Platelet   Folate   Iron and TIBC   Ferritin      Insulin Resistance Last fasting insulin was 4.8. A1c was 5.4. Polyphagia:No Medication(s): None She is working on nutrition plan to decrease simple carbohydrates, increase lean proteins and exercise to promote weight loss, improve glycemic control and prevent progression to Type 2 diabetes.   Lab Results  Component Value Date   HGBA1C 5.4 08/04/2022   HGBA1C 5.2 02/20/2022  HGBA1C 5.1 10/31/2021   HGBA1C 5.4 04/17/2021   HGBA1C 5.4 12/20/2020   Lab Results  Component Value Date   INSULIN 4.8 08/04/2022   INSULIN 8.3 02/20/2022   INSULIN 11.2 10/31/2021   INSULIN 9.0 04/17/2021   INSULIN 23.5 12/20/2020    Plan:  Continue working on nutrition plan to decrease simple carbohydrates, increase lean proteins and exercise to promote weight loss, improve glycemic control and prevent progression to Type 2 diabetes.     Vitamin D Deficiency Vitamin D is not at goal of 50.  Most recent vitamin D level was 37.2. She is on  prescription ergocalciferol 50,000 IU weekly. Lab Results  Component Value Date   VD25OH 37.2 08/04/2022   VD25OH 64.0 02/20/2022   VD25OH 43.3 10/31/2021    Plan: Continue and refill  prescription ergocalciferol 50,000 IU weekly Recheck vitamin D level today and adjust supplementation accordingly.  Low vitamin D levels can be associated with adiposity and may result in leptin resistance and weight gain. Also associated with fatigue. Currently on vitamin D supplementation without any adverse effects.    Abnormal RBC indices: Low normal Hgb/HCT with low indices. On Ollie MVI, but no iron  supplementation.  On Protonix for GERD long term. No Abd pain. Hx of lap Appy 12/2012.  Plan: Recheck labs today. ? Fe deficiency. May need further evaluation, referral to GI.   ATTESTASTION STATEMENTS:  Reviewed by clinician on day of visit: allergies, medications, problem list, medical history, surgical history, family history, social history, and previous encounter notes.   I have personally spent 40 minutes total time today in preparation, patient care, nutritional counseling and documentation for this visit, including the following: review of clinical lab tests; review of medical tests/procedures/services.      Glee Lashomb, PA-C

## 2022-11-24 ENCOUNTER — Ambulatory Visit (INDEPENDENT_AMBULATORY_CARE_PROVIDER_SITE_OTHER): Payer: BC Managed Care – PPO | Admitting: Physician Assistant

## 2022-11-24 ENCOUNTER — Encounter (INDEPENDENT_AMBULATORY_CARE_PROVIDER_SITE_OTHER): Payer: Self-pay | Admitting: Physician Assistant

## 2022-11-24 VITALS — BP 92/55 | HR 66 | Temp 98.0°F | Ht 61.0 in | Wt 204.0 lb

## 2022-11-24 DIAGNOSIS — R718 Other abnormality of red blood cells: Secondary | ICD-10-CM

## 2022-11-24 DIAGNOSIS — E88819 Insulin resistance, unspecified: Secondary | ICD-10-CM

## 2022-11-24 DIAGNOSIS — Z6838 Body mass index (BMI) 38.0-38.9, adult: Secondary | ICD-10-CM

## 2022-11-24 DIAGNOSIS — E559 Vitamin D deficiency, unspecified: Secondary | ICD-10-CM | POA: Diagnosis not present

## 2022-11-24 DIAGNOSIS — E785 Hyperlipidemia, unspecified: Secondary | ICD-10-CM

## 2022-11-24 DIAGNOSIS — E669 Obesity, unspecified: Secondary | ICD-10-CM

## 2022-11-24 DIAGNOSIS — F5089 Other specified eating disorder: Secondary | ICD-10-CM

## 2022-11-25 LAB — ANEMIA PANEL
Ferritin: 109 ng/mL (ref 15–150)
Folate, Hemolysate: 337 ng/mL
Folate, RBC: 889 ng/mL (ref >498)
Hematocrit: 37.9 % (ref 34.0–46.6)
Iron Saturation: 22 % (ref 15–55)
Iron: 68 ug/dL (ref 27–159)
Retic Ct Pct: 2 % (ref 0.6–2.6)
Total Iron Binding Capacity: 305 ug/dL (ref 250–450)
UIBC: 237 ug/dL (ref 131–425)
Vitamin B-12: 744 pg/mL (ref 232–1245)

## 2022-11-25 LAB — CMP14+EGFR
ALT: 11 IU/L (ref 0–32)
AST: 16 IU/L (ref 0–40)
Albumin: 4.2 g/dL (ref 4.0–5.0)
Alkaline Phosphatase: 90 IU/L (ref 44–121)
BUN/Creatinine Ratio: 16 (ref 9–23)
BUN: 10 mg/dL (ref 6–20)
Bilirubin Total: 0.3 mg/dL (ref 0.0–1.2)
CO2: 23 mmol/L (ref 20–29)
Calcium: 9.6 mg/dL (ref 8.7–10.2)
Chloride: 99 mmol/L (ref 96–106)
Creatinine, Ser: 0.62 mg/dL (ref 0.57–1.00)
Globulin, Total: 3.2 g/dL (ref 1.5–4.5)
Glucose: 79 mg/dL (ref 70–99)
Potassium: 4.3 mmol/L (ref 3.5–5.2)
Sodium: 137 mmol/L (ref 134–144)
Total Protein: 7.4 g/dL (ref 6.0–8.5)
eGFR: 128 mL/min/{1.73_m2} (ref >59)

## 2022-11-25 LAB — CBC WITH DIFFERENTIAL/PLATELET
Basophils Absolute: 0 10*3/uL (ref 0.0–0.2)
Basos: 0 %
EOS (ABSOLUTE): 0.1 10*3/uL (ref 0.0–0.4)
Eos: 1 %
Hemoglobin: 12.7 g/dL (ref 11.1–15.9)
Immature Grans (Abs): 0 10*3/uL (ref 0.0–0.1)
Immature Granulocytes: 0 %
Lymphocytes Absolute: 2.9 10*3/uL (ref 0.7–3.1)
Lymphs: 33 %
MCH: 27.5 pg (ref 26.6–33.0)
MCHC: 33.5 g/dL (ref 31.5–35.7)
MCV: 82 fL (ref 79–97)
Monocytes Absolute: 0.4 10*3/uL (ref 0.1–0.9)
Monocytes: 4 %
Neutrophils Absolute: 5.4 10*3/uL (ref 1.4–7.0)
Neutrophils: 62 %
Platelets: 353 10*3/uL (ref 150–450)
RBC: 4.61 x10E6/uL (ref 3.77–5.28)
RDW: 13 % (ref 11.7–15.4)
WBC: 8.8 10*3/uL (ref 3.4–10.8)

## 2022-11-25 LAB — FOLATE: Folate: 14.9 ng/mL (ref >3.0)

## 2022-11-25 LAB — VITAMIN D 25 HYDROXY (VIT D DEFICIENCY, FRACTURES): Vit D, 25-Hydroxy: 40.6 ng/mL (ref 30.0–100.0)

## 2022-12-25 ENCOUNTER — Ambulatory Visit (INDEPENDENT_AMBULATORY_CARE_PROVIDER_SITE_OTHER): Payer: BC Managed Care – PPO | Admitting: Physician Assistant

## 2022-12-31 ENCOUNTER — Encounter: Payer: BC Managed Care – PPO | Admitting: Family Medicine

## 2023-01-07 ENCOUNTER — Ambulatory Visit (INDEPENDENT_AMBULATORY_CARE_PROVIDER_SITE_OTHER): Payer: Commercial Managed Care - PPO | Admitting: Family Medicine

## 2023-01-07 VITALS — BP 98/66 | HR 78 | Temp 97.8°F | Ht 60.04 in | Wt 207.0 lb

## 2023-01-07 DIAGNOSIS — L700 Acne vulgaris: Secondary | ICD-10-CM | POA: Diagnosis not present

## 2023-01-07 DIAGNOSIS — E559 Vitamin D deficiency, unspecified: Secondary | ICD-10-CM

## 2023-01-07 DIAGNOSIS — Z23 Encounter for immunization: Secondary | ICD-10-CM

## 2023-01-07 DIAGNOSIS — Z0000 Encounter for general adult medical examination without abnormal findings: Secondary | ICD-10-CM

## 2023-01-07 NOTE — Assessment & Plan Note (Signed)
Pt's PE WNL w/ exception of BMI.  UTD on pap, Tdap, flu shot given today.  Check labs.  Anticipatory guidance provided.

## 2023-01-07 NOTE — Progress Notes (Signed)
   Subjective:    Patient ID: Anna Holland, female    DOB: [birth year], 22 y.o.   MRN: [id number]  HPI CPE- UTD on pap, Tdap.  Flu today.  Pt is working as Charity fundraiser in [employer].  Currently dating and feels safe in relationship.  Has IUD for contraception.  Patient Care Team    Relationship Specialty Notifications Start End  Sheliah Hatch, MD PCP - General Family Medicine  09/02/17   Jacqlyn Krauss, MD Referring Physician Dermatology  09/02/17   Hal Morales, MD (Inactive) Consulting Physician Obstetrics and Gynecology  09/02/17     Health Maintenance  Topic Date Due   CHLAMYDIA SCREENING  Never done   Cervical Cancer Screening (Pap smear)  03/20/2024   DTaP/Tdap/Td (8 - Td or Tdap) 06/14/2030   INFLUENZA VACCINE  Completed   HPV VACCINES  Completed   HIV Screening  Completed   COVID-19 Vaccine  Discontinued   Hepatitis C Screening  Discontinued     Review of Systems Patient reports no vision/ hearing changes, adenopathy,fever, weight change,  persistant/recurrent hoarseness , swallowing issues, chest pain, palpitations, edema, persistant/recurrent cough, hemoptysis, dyspnea (rest/exertional/paroxysmal nocturnal), gastrointestinal bleeding (melena, rectal bleeding), abdominal pain, significant heartburn, bowel changes, GU symptoms (dysuria, hematuria, incontinence), Gyn symptoms (abnormal  bleeding, pain),  syncope, focal weakness, memory loss, numbness & tingling, hair/nail changes, abnormal bruising or bleeding, anxiety, or depression.   + acne- used OTC differin w/o improvement.  Using medicated face wash.  Pt went through round of accutane w/o relief ([place]).    Objective:   Physical Exam General Appearance:    Alert, cooperative, no distress, appears stated age, obese  Head:    Normocephalic, without obvious abnormality, atraumatic  Eyes:    PERRL, conjunctiva/corneas clear, EOM's intact both eyes  Ears:    Normal TM's and external ear canals, both ears  Nose:    Nares normal, septum midline, mucosa normal, no drainage    or sinus tenderness  Throat:   Lips, mucosa, and tongue normal; teeth and gums normal  Neck:   Supple, symmetrical, trachea midline, no adenopathy;    Thyroid: no enlargement/tenderness/nodules  Back:     Symmetric, no curvature, ROM normal, no CVA tenderness  Lungs:     Clear to auscultation bilaterally, respirations unlabored  Chest Wall:    No tenderness or deformity   Heart:    Regular rate and rhythm, S1 and S2 normal, no murmur, rub   or gallop  Breast Exam:    Deferred to GYN  Abdomen:     Soft, non-tender, bowel sounds active all four quadrants,    no masses, no organomegaly  Genitalia:    Deferred to GYN  Rectal:    Extremities:   Extremities normal, atraumatic, no cyanosis or edema  Pulses:   2+ and symmetric all extremities  Skin:   Skin color, texture, turgor normal, no rashes, + facial acne  Lymph nodes:   Cervical, supraclavicular, and axillary nodes normal  Neurologic:   CNII-XII intact, normal strength, sensation and reflexes    throughout          Assessment & Plan:

## 2023-01-07 NOTE — Assessment & Plan Note (Signed)
Check labs and replete prn. 

## 2023-01-07 NOTE — Patient Instructions (Signed)
Follow up in 1 year or as needed We'll notify you of your lab results and make any changes if needed Keep up the good work on healthy diet and regular exercise- you're doing great! We'll call you to schedule your dermatology appt Call with any questions or concerns Stay Safe!  Stay Healthy! Happy Fall!!!

## 2023-01-08 LAB — CBC WITH DIFFERENTIAL/PLATELET
Basophils Absolute: 0.1 10*3/uL (ref 0.0–0.1)
Basophils Relative: 0.7 % (ref 0.0–3.0)
Eosinophils Absolute: 0.1 10*3/uL (ref 0.0–0.7)
Eosinophils Relative: 1.5 % (ref 0.0–5.0)
HCT: 38.5 % (ref 36.0–46.0)
Hemoglobin: 12.5 g/dL (ref 12.0–15.0)
Lymphocytes Relative: 34.3 % (ref 12.0–46.0)
Lymphs Abs: 2.5 10*3/uL (ref 0.7–4.0)
MCHC: 32.4 g/dL (ref 30.0–36.0)
MCV: 83.4 fL (ref 78.0–100.0)
Monocytes Absolute: 0.3 10*3/uL (ref 0.1–1.0)
Monocytes Relative: 4.3 % (ref 3.0–12.0)
Neutro Abs: 4.4 10*3/uL (ref 1.4–7.7)
Neutrophils Relative %: 59.2 % (ref 43.0–77.0)
Platelets: 376 10*3/uL (ref 150.0–400.0)
RBC: 4.61 Mil/uL (ref 3.87–5.11)
RDW: 13.3 % (ref 11.5–15.5)
WBC: 7.4 10*3/uL (ref 4.0–10.5)

## 2023-01-08 LAB — BASIC METABOLIC PANEL
BUN: 10 mg/dL (ref 6–23)
CO2: 27 meq/L (ref 19–32)
Calcium: 9.2 mg/dL (ref 8.4–10.5)
Chloride: 104 meq/L (ref 96–112)
Creatinine, Ser: 0.5 mg/dL (ref 0.40–1.20)
GFR: 132.04 mL/min (ref >60.00)
Glucose, Bld: 82 mg/dL (ref 70–99)
Potassium: 4.7 meq/L (ref 3.5–5.1)
Sodium: 137 meq/L (ref 135–145)

## 2023-01-08 LAB — LIPID PANEL
Cholesterol: 160 mg/dL (ref 0–200)
HDL: 35.7 mg/dL — ABNORMAL LOW (ref >39.00)
LDL Cholesterol: 113 mg/dL — ABNORMAL HIGH (ref 0–99)
NonHDL: 124.79
Total CHOL/HDL Ratio: 4
Triglycerides: 58 mg/dL (ref 0.0–149.0)
VLDL: 11.6 mg/dL (ref 0.0–40.0)

## 2023-01-08 LAB — TSH: TSH: 0.63 u[IU]/mL (ref 0.35–5.50)

## 2023-01-08 LAB — HEPATIC FUNCTION PANEL
ALT: 10 U/L (ref 0–35)
AST: 15 U/L (ref 0–37)
Albumin: 3.8 g/dL (ref 3.5–5.2)
Alkaline Phosphatase: 72 U/L (ref 39–117)
Bilirubin, Direct: 0.1 mg/dL (ref 0.0–0.3)
Total Bilirubin: 0.4 mg/dL (ref 0.2–1.2)
Total Protein: 7 g/dL (ref 6.0–8.3)

## 2023-01-08 LAB — VITAMIN D 25 HYDROXY (VIT D DEFICIENCY, FRACTURES): VITD: 22.25 ng/mL — ABNORMAL LOW (ref 30.00–100.00)

## 2023-01-08 LAB — HEMOGLOBIN A1C: Hgb A1c MFr Bld: 5.1 % (ref 4.6–6.5)

## 2023-01-09 ENCOUNTER — Telehealth: Payer: Self-pay

## 2023-01-09 ENCOUNTER — Other Ambulatory Visit: Payer: Self-pay

## 2023-01-09 DIAGNOSIS — E559 Vitamin D deficiency, unspecified: Secondary | ICD-10-CM

## 2023-01-09 MED ORDER — VITAMIN D (ERGOCALCIFEROL) 1.25 MG (50000 UNIT) PO CAPS
50000.0000 [IU] | ORAL_CAPSULE | ORAL | 0 refills | Status: DC
Start: 2023-01-09 — End: 2023-08-05

## 2023-01-09 NOTE — Telephone Encounter (Signed)
-----   Message from Neena Rhymes sent at 01/09/2023 12:38 PM EDT ----- Labs look good w/ exception of low Vit D.  Based on this, we need to start 50,000 units weekly x12 weeks in addition to daily OTC supplement of at least 2000 units.

## 2023-01-09 NOTE — Telephone Encounter (Signed)
She saw labs results on MyChart no need to call her back

## 2023-01-11 NOTE — Progress Notes (Unsigned)
.smr  Office: [phone]  /  Fax: [phone]  WEIGHT SUMMARY AND BIOMETRICS  Vitals Temp: 98.3 F (36.8 C) BP: (!) 89/58 Pulse Rate: 72 SpO2: 98 %   Anthropometric Measurements Height: 5' (1.524 m) Weight: 203 lb (92.1 kg) BMI (Calculated): 39.65 Weight at Last Visit: 204 lb Weight Lost Since Last Visit: 1 lb Weight Gained Since Last Visit: 0 Starting Weight: 235 lb Total Weight Loss (lbs): 32 lb (14.5 kg) Peak Weight: 235 lb   Body Composition  Body Fat %: 44.3 % Fat Mass (lbs): 90 lbs Muscle Mass (lbs): 107.2 lbs Total Body Water (lbs): 80.2 lbs Visceral Fat Rating : 10   Other Clinical Data Fasting: yes Labs: no Today's Visit #: 30 Starting Date: 12/20/20     HPI  Chief Complaint: OBESITY  Anna Holland is here to discuss her progress with her obesity treatment plan. She is on the the Category 1 Plan and states she is following her eating plan approximately 75 % of the time. She states she is exercising 0 minutes 0 times per week.   Interval History:  Since last office visit she down 1 lb. Down 32 lbs overall TBW loss 13.6% ! The patient is a 22 year old female with a history of obesity and vitamin D deficiency. She presents for a follow-up visit to discuss her ongoing treatment plan. 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 She also mentions that her work schedule has led to unintentional skipping of meals- mainly lunch meal.  Prescribed vitamin D supplements per PCP as had [facility] recently.  The patient also reports a decrease in her physical activity. She has access to a treadmill and an exercise bike at home but has not been using them regularly. She acknowledges the need to start exercising again, even if it's for a short duration. Working on maintaining good hydration.She tries to refill her 32-ounce water bottle at  least once or twice every time she's at work.   Pharmacotherapy: None for weight loss  TREATMENT PLAN FOR OBESITY: Obesity Patient is on a treatment plan. She is currently adjusting to a new work schedule which has affected her exercise routine and meal timing. -Encouraged to maintain protein intake and hydration. Find ways like drinking a protein shake during lunch to maintain good protein intake consistently when working.  -Advised to resume regular exercise, even if less intense than previous routine. -Scheduled for follow-up appointment on 02/17/2023. Recommended Dietary Goals  Anna Holland is currently in the action stage of change. As such, her goal is to continue weight management plan. She has agreed to the Category 1 Plan.  Behavioral Intervention  We discussed the following Behavioral Modification Strategies today: increasing lean protein intake to established goals, decreasing simple carbohydrates , increasing vegetables, increasing lower glycemic fruits, increasing fiber rich foods, avoiding skipping meals, decreasing eating out or consumption of processed foods, and making healthy choices when eating convenient foods, practice mindfulness eating and understand the difference between hunger signals and cravings, work on managing stress, creating time for self-care and relaxation, and continue to work on maintaining a reduced calorie state, getting the recommended amount of protein, incorporating whole foods, making healthy choices, staying well hydrated and practicing mindfulness when eating..  Additional resources provided today: NA  Recommended Physical Activity Goals  Anna Holland has been advised to work up to 150 minutes of  moderate intensity aerobic activity a week and strengthening exercises 2-3 times per week for cardiovascular health, weight loss maintenance and preservation of muscle mass.   She has agreed to Increase physical activity in their day and reduce sedentary time  (increase NEAT). and Resume usual working with treadmill or elliptical 3 times weekly   Pharmacotherapy We discussed various medication options to help Anna Holland with her weight loss efforts and we both agreed to continue to work on nutritional and behavioral strategies to promote weight loss.     Return in about 4 weeks (around 02/09/2023).Marland Kitchen She was informed of the importance of frequent follow up visits to maximize her success with intensive lifestyle modifications for her multiple health conditions.  PHYSICAL EXAM:  Blood pressure (!) 89/58, pulse 72, temperature 98.3 F (36.8 C), height 5' (1.524 m), weight 203 lb (92.1 kg), SpO2 98%. Body mass index is 39.65 kg/m.  General: She is overweight, cooperative, alert, well developed, and in no acute distress. PSYCH: Has normal mood, affect and thought process.   Cardiovascular: HR 70's BP 89/58 Lungs: Normal breathing effort, no conversational dyspnea. Neuro: no focal deficits  DIAGNOSTIC DATA REVIEWED:  BMET    Component Value Date/Time   NA 137 01/07/2023 1442   NA 137 11/24/2022 1556   K 4.7 01/07/2023 1442   CL 104 01/07/2023 1442   CO2 27 01/07/2023 1442   GLUCOSE 82 01/07/2023 1442   BUN 10 01/07/2023 1442   BUN 10 11/24/2022 1556   CREATININE 0.50 01/07/2023 1442   CALCIUM 9.2 01/07/2023 1442   GFRNONAA >60 03/10/2022 0054   GFRAA NOT CALCULATED 12/29/2012 2056   Lab Results  Component Value Date   HGBA1C 5.1 01/07/2023   HGBA1C 5.4 12/20/2020   Lab Results  Component Value Date   INSULIN 4.8 08/04/2022   INSULIN 23.5 12/20/2020   Lab Results  Component Value Date   TSH 0.63 01/07/2023   CBC    Component Value Date/Time   WBC 7.4 01/07/2023 1442   RBC 4.61 01/07/2023 1442   HGB 12.5 01/07/2023 1442   HGB 12.7 11/24/2022 1556   HCT 38.5 01/07/2023 1442   HCT 37.9 11/24/2022 1556   PLT 376.0 01/07/2023 1442   PLT 353 11/24/2022 1556   MCV 83.4 01/07/2023 1442   MCV 82 11/24/2022 1556   MCH 27.5  11/24/2022 1556   MCH 27.0 03/10/2022 0054   MCHC 32.4 01/07/2023 1442   RDW 13.3 01/07/2023 1442   RDW 13.0 11/24/2022 1556   Iron Studies    Component Value Date/Time   IRON 68 11/24/2022 1556   TIBC 305 11/24/2022 1556   FERRITIN 109 11/24/2022 1556   IRONPCTSAT 22 11/24/2022 1556   Lipid Panel     Component Value Date/Time   CHOL 160 01/07/2023 1442   CHOL 144 08/04/2022 0749   TRIG 58.0 01/07/2023 1442   HDL 35.70 (L) 01/07/2023 1442   HDL 33 (L) 08/04/2022 0749   CHOLHDL 4 01/07/2023 1442   VLDL 11.6 01/07/2023 1442   LDLCALC 113 (H) 01/07/2023 1442   LDLCALC 97 08/04/2022 0749   LDLDIRECT 126.0 09/02/2017 1452   Hepatic Function Panel     Component Value Date/Time   PROT 7.0 01/07/2023 1442   PROT 7.4 11/24/2022 1556   ALBUMIN 3.8 01/07/2023 1442   ALBUMIN 4.2 11/24/2022 1556   AST 15 01/07/2023 1442   ALT 10 01/07/2023 1442   ALKPHOS 72 01/07/2023 1442   BILITOT 0.4 01/07/2023 1442   BILITOT 0.3  11/24/2022 1556   BILIDIR 0.1 01/07/2023 1442      Component Value Date/Time   TSH 0.63 01/07/2023 1442   Nutritional Lab Results  Component Value Date   VD25OH 22.25 (L) 01/07/2023   VD25OH 40.6 11/24/2022   VD25OH 37.2 08/04/2022    ASSOCIATED CONDITIONS ADDRESSED TODAY  ASSESSMENT AND PLAN  Problem List Items Addressed This Visit     Obesity - Start BMI- 45.9   Insulin resistance   Vitamin D deficiency - Primary   BMI 39.0-39.9,adult Current BMI 39.6   Insulin Resistance Last fasting insulin was 4.8- at goal. A1c was 5.1 - at goal. Polyphagia:No Medication(s): None She is working on nutrition plan to decrease simple carbohydrates, increase lean proteins and exercise to promote weight loss, improve glycemic control and prevent progression to Type 2 diabetes.   Lab Results  Component Value Date   HGBA1C 5.1 01/07/2023   HGBA1C 5.4 08/04/2022   HGBA1C 5.2 02/20/2022   HGBA1C 5.1 10/31/2021   HGBA1C 5.4 04/17/2021   Lab Results  Component  Value Date   INSULIN 4.8 08/04/2022   INSULIN 8.3 02/20/2022   INSULIN 11.2 10/31/2021   INSULIN 9.0 04/17/2021   INSULIN 23.5 12/20/2020    Plan:  Continue working on nutrition plan to decrease simple carbohydrates, increase lean proteins and exercise to promote weight loss, improve glycemic control and prevent progression to Type 2 diabetes.    Vitamin D Deficiency Vitamin D is not at goal of 50.  Most recent vitamin D level was 22.25. She is on  prescription ergocalciferol 50,000 IU weekly. Prescribed by her PCP at [facility] recently based on labs. No N/V or muscle weakness with Ergocalciferol in past.  Lab Results  Component Value Date   VD25OH 22.25 (L) 01/07/2023   VD25OH 40.6 11/24/2022   VD25OH 37.2 08/04/2022    Plan: Continue  prescription ergocalciferol 50,000 IU weekly Low vitamin D levels can be associated with adiposity and may result in leptin resistance and weight gain. Also associated with fatigue. Currently on vitamin D supplementation without any adverse effects.  Recheck vitamin D level in 3 months to optimize supplementation/avoid over supplementation.    General health maintenance Patient has a history of low blood pressure and dizziness, but previous anemia studies were normal. Hemoglobin and hematocrit are at the lower end of the normal range. -Encouraged to maintain hydration and consider a multivitamin with iron to improve energy levels.-Consider adding a multivitamin with iron, such as Flintstones with iron, to improve energy levels. ATTESTASTION STATEMENTS:  Reviewed by clinician on day of visit: allergies, medications, problem list, medical history, surgical history, family history, social history, and previous encounter notes.   I have personally spent 30 minutes total time today in preparation, patient care, nutritional counseling and documentation for this visit, including the following: review of clinical lab tests; review of medical  tests/procedures/services.      Paxten Appelt, PA-C

## 2023-01-12 ENCOUNTER — Encounter (INDEPENDENT_AMBULATORY_CARE_PROVIDER_SITE_OTHER): Payer: Self-pay | Admitting: Physician Assistant

## 2023-01-12 ENCOUNTER — Ambulatory Visit (INDEPENDENT_AMBULATORY_CARE_PROVIDER_SITE_OTHER): Payer: Commercial Managed Care - PPO | Admitting: Physician Assistant

## 2023-01-12 VITALS — BP 89/58 | HR 72 | Temp 98.3°F | Ht 60.0 in | Wt 203.0 lb

## 2023-01-12 DIAGNOSIS — Z6839 Body mass index (BMI) 39.0-39.9, adult: Secondary | ICD-10-CM | POA: Diagnosis not present

## 2023-01-12 DIAGNOSIS — E559 Vitamin D deficiency, unspecified: Secondary | ICD-10-CM | POA: Diagnosis not present

## 2023-01-12 DIAGNOSIS — E669 Obesity, unspecified: Secondary | ICD-10-CM

## 2023-01-12 DIAGNOSIS — E88819 Insulin resistance, unspecified: Secondary | ICD-10-CM | POA: Diagnosis not present

## 2023-02-12 ENCOUNTER — Encounter (INDEPENDENT_AMBULATORY_CARE_PROVIDER_SITE_OTHER): Payer: Self-pay | Admitting: Adult Health

## 2023-02-12 ENCOUNTER — Ambulatory Visit (INDEPENDENT_AMBULATORY_CARE_PROVIDER_SITE_OTHER): Payer: Commercial Managed Care - PPO | Admitting: Adult Health

## 2023-02-12 VITALS — BP 94/68 | HR 78 | Temp 97.9°F | Ht 60.0 in | Wt 205.0 lb

## 2023-02-12 DIAGNOSIS — R031 Nonspecific low blood-pressure reading: Secondary | ICD-10-CM | POA: Diagnosis not present

## 2023-02-12 DIAGNOSIS — E88819 Insulin resistance, unspecified: Secondary | ICD-10-CM

## 2023-02-12 DIAGNOSIS — E559 Vitamin D deficiency, unspecified: Secondary | ICD-10-CM | POA: Diagnosis not present

## 2023-02-12 DIAGNOSIS — Z6839 Body mass index (BMI) 39.0-39.9, adult: Secondary | ICD-10-CM | POA: Diagnosis not present

## 2023-02-12 DIAGNOSIS — E669 Obesity, unspecified: Secondary | ICD-10-CM | POA: Diagnosis not present

## 2023-02-12 NOTE — Progress Notes (Signed)
WEIGHT SUMMARY AND BIOMETRICS  Vitals Temp: 97.9 F (36.6 C) BP: 94/68 Pulse Rate: 78 SpO2: 99 %   Anthropometric Measurements Height: 5' (1.524 m) Weight: 205 lb (93 kg) BMI (Calculated): 40.04 Weight at Last Visit: 203 lb Weight Lost Since Last Visit: 0 Weight Gained Since Last Visit: 2 lb Starting Weight: 235 lb Total Weight Loss (lbs): 30 lb (13.6 kg) Peak Weight: 235 lb   Body Composition  Body Fat %: 44.7 % Fat Mass (lbs): 91.6 lbs Muscle Mass (lbs): 107.6 lbs Total Body Water (lbs): 79.6 lbs Visceral Fat Rating : 10   Other Clinical Data Fasting: no Labs: no Today's Visit #: 31 Starting Date: 12/20/20    Chief Complaint:   OBESITY Anna Holland is here to discuss her progress with her obesity treatment plan. She is on the the Category 1 Plan and states she is following her eating plan approximately 80 % of the time.  She states she is exercising Walking Outside and Walking on Treadmill 30 minutes 2 times per week.   Interim History:  01/07/2023 OV with PCP- AWV with labs 01/09/2023 Started on weekly Ergocalciferol and daily OTC supplement of at least 2000 units.   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 Low blood pressure reading EPIC review SBP 80-120s SDP 50-80s  She estimates to drink > 60 oz water/day She denies sx's of hypotension She denies family hx of conduction disorders Known of her family members have a PPM or AICD  2. Vitamin D deficiency  Latest Reference Range & Units 11/24/22 15:56 01/07/23 14:42  Vitamin D, 25-Hydroxy 30.0 - 100.0 ng/mL 40.6    VITD 30.00 - 100.00 ng/mL  22.25 (L)  (L): Data is abnormally low 01/09/2023 Started on weekly Ergocalciferol and daily OTC supplement of at least 2000 units.   3. Insulin resistance  Latest Reference Range & Units 12/20/20 09:41 04/17/21 10:39 10/31/21 09:00 02/20/22 11:23 08/04/22 07:49  INSULIN 2.6 - 24.9 uIU/mL 23.5 9.0 11.2 8.3 4.8   Per pt- she was on Wegovy 0.25mg  therapy > 1 year ago She tolerated well She was unable to find Wegovy refills national drug shortage, then her insurance stopped coverage  Assessment/Plan:   1. Low blood pressure reading Increase water intake Add a few shakes of Na+ on food Monitor for sx's of hypotension  2. Vitamin D deficiency weekly Ergocalciferol and daily OTC supplement of at least 2000 units.   3. Insulin resistance Increase protein Increase regular exercise  4. BMI 39.0-39.9,adult Current BMI 40.4  Miette is currently in the action stage of change. As such, her goal is to continue with weight loss efforts. She has agreed to the Category 1 Plan.   Exercise goals: For substantial health benefits, adults should do at least 150 minutes (2 hours and 30 minutes) a week of moderate-intensity, or 75 minutes (1 hour and 15 minutes) a week of vigorous-intensity aerobic physical activity, or an equivalent combination of moderate- and vigorous-intensity aerobic activity. Aerobic activity should be performed in episodes of at least 10 minutes, and preferably, it should  be spread throughout the week.  Behavioral modification strategies: increasing lean protein intake, decreasing simple carbohydrates, increasing vegetables, increasing water intake, no skipping meals, meal planning and cooking strategies, keeping healthy foods in the home, ways to avoid boredom eating, better snacking choices, and planning for success.  Lashannon has agreed to follow-up with our clinic in 4 weeks. She was informed of the importance of frequent follow-up visits to maximize  her success with intensive lifestyle modifications for her multiple health conditions.   Objective:   Blood pressure 94/68, pulse 78, temperature 97.9 F (36.6 C), height 5' (1.524 m), weight 205 lb (93 kg), last menstrual period 02/05/2023, SpO2 99%. Body mass index is 40.04 kg/m.  General: Cooperative, alert, well developed, in no acute distress. HEENT: Conjunctivae and lids unremarkable. Cardiovascular: Regular rhythm.  Lungs: Normal work of breathing. Neurologic: No focal deficits.   Lab Results  Component Value Date   CREATININE 0.50 01/07/2023   BUN 10 01/07/2023   NA 137 01/07/2023   K 4.7 01/07/2023   CL 104 01/07/2023   CO2 27 01/07/2023   Lab Results  Component Value Date   ALT 10 01/07/2023   AST 15 01/07/2023   ALKPHOS 72 01/07/2023   BILITOT 0.4 01/07/2023   Lab Results  Component Value Date   HGBA1C 5.1 01/07/2023   HGBA1C 5.4 08/04/2022   HGBA1C 5.2 02/20/2022   HGBA1C 5.1 10/31/2021   HGBA1C 5.4 04/17/2021   Lab Results  Component Value Date   INSULIN 4.8 08/04/2022   INSULIN 8.3 02/20/2022   INSULIN 11.2 10/31/2021   INSULIN 9.0 04/17/2021   INSULIN 23.5 12/20/2020   Lab Results  Component Value Date   TSH 0.63 01/07/2023   Lab Results  Component Value Date   CHOL 160 01/07/2023   HDL 35.70 (L) 01/07/2023   LDLCALC 113 (H) 01/07/2023   LDLDIRECT 126.0 09/02/2017   TRIG 58.0 01/07/2023   CHOLHDL 4 01/07/2023   Lab Results  Component Value Date   VD25OH 22.25 (L) 01/07/2023   VD25OH 40.6 11/24/2022   VD25OH 37.2 08/04/2022   Lab Results  Component Value Date   WBC 7.4 01/07/2023   HGB 12.5 01/07/2023   HCT 38.5 01/07/2023   MCV 83.4 01/07/2023   PLT 376.0 01/07/2023   Lab Results  Component Value Date   IRON 68 11/24/2022   TIBC 305 11/24/2022   FERRITIN 109 11/24/2022   Attestation Statements:   Reviewed by clinician on day of visit: allergies, medications, problem list, medical history, surgical history, family  history, social history, and previous encounter notes.  Time spent on visit including pre-visit chart review and post-visit care and charting was 27 minutes.   I have reviewed the above documentation for accuracy and completeness, and I agree with the above. -  Jawanna Dykman d. Joaquim Tolen, NP-C

## 2023-02-16 DIAGNOSIS — L700 Acne vulgaris: Secondary | ICD-10-CM | POA: Diagnosis not present

## 2023-02-17 ENCOUNTER — Other Ambulatory Visit: Payer: Self-pay | Admitting: Family Medicine

## 2023-02-17 ENCOUNTER — Ambulatory Visit (INDEPENDENT_AMBULATORY_CARE_PROVIDER_SITE_OTHER): Payer: BC Managed Care – PPO | Admitting: Physician Assistant

## 2023-03-12 ENCOUNTER — Encounter (INDEPENDENT_AMBULATORY_CARE_PROVIDER_SITE_OTHER): Payer: Self-pay | Admitting: Adult Health

## 2023-03-12 ENCOUNTER — Ambulatory Visit (INDEPENDENT_AMBULATORY_CARE_PROVIDER_SITE_OTHER): Payer: Commercial Managed Care - PPO | Admitting: Adult Health

## 2023-03-12 DIAGNOSIS — E669 Obesity, unspecified: Secondary | ICD-10-CM | POA: Diagnosis not present

## 2023-03-12 DIAGNOSIS — R031 Nonspecific low blood-pressure reading: Secondary | ICD-10-CM | POA: Diagnosis not present

## 2023-03-12 DIAGNOSIS — Z6839 Body mass index (BMI) 39.0-39.9, adult: Secondary | ICD-10-CM

## 2023-03-12 DIAGNOSIS — E88819 Insulin resistance, unspecified: Secondary | ICD-10-CM

## 2023-03-12 DIAGNOSIS — E559 Vitamin D deficiency, unspecified: Secondary | ICD-10-CM

## 2023-03-12 NOTE — Progress Notes (Addendum)
WEIGHT SUMMARY AND BIOMETRICS  Vitals Temp: 97.6 F (36.4 C) BP: 98/61 Pulse Rate: 80 SpO2: 98 %   Anthropometric Measurements Height: 5' (1.524 m) Weight: 202 lb (91.6 kg) BMI (Calculated): 39.45 Weight at Last Visit: 205lb Weight Lost Since Last Visit: 3lb Weight Gained Since Last Visit: 0 Starting Weight: 235lb Total Weight Loss (lbs): 33 lb (15 kg) Peak Weight: 235lb   Body Composition  Body Fat %: 43.7 % Fat Mass (lbs): 88.4 lbs Muscle Mass (lbs): 108.2 lbs Total Body Water (lbs): 79.2 lbs Visceral Fat Rating : 10   Other Clinical Data Fasting: no Labs: no Today's Visit #: 32 Starting Date: 12/20/20    Chief Complaint:   OBESITY Anna Holland is here to discuss her progress with her obesity treatment plan. She is on the the Category 1 Plan and states she is following her eating plan approximately 80 % of the time. She states she is exercising Treadmill/Strength Training 30 minutes 2 times per week.   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 Low blood pressure reading BP soft, yet stable She denies dizziness at present EPIC synopsis demonstrates SBP 80-120s DBP40-70s Of note- Short course of Spironolactone 50mg  BID per Derm Rx initially provided on 02/16/2023, she started Rx on 02/17/2023  2. Vitamin D deficiency PCP recently provided 3 month supply of Ergocalciferol- denies N/V/Muscle Weakness  3. Insulin resistance Anna Holland denies polyphagia She is seeking a gym to join in 2025- discussed local options- list provided to pt  Assessment/Plan:   1. Low blood pressure reading Remain well hydrated Add a few shakes of Na+ onto food Monitor for sx's of hypotension  2. Vitamin D deficiency Continue weekly  Ergocalciferol  3. Insulin resistance Increase protein and limit sugar/simple CHO Try out various local gyms- remember to ask about [facility] Employee discount  4. BMI 39.0-39.9,adult Current BMI 39.45  Anna Holland is currently in the action stage of change. As such, her goal is to continue with weight loss efforts. She has agreed to the Category 1 Plan.   Exercise goals: For substantial health benefits, adults should do at least 150 minutes (2 hours and 30 minutes) a week of moderate-intensity, or 75 minutes (1 hour and 15 minutes) a week of vigorous-intensity aerobic physical activity, or an equivalent combination of moderate- and vigorous-intensity aerobic activity. Aerobic activity should be performed in episodes of at least 10 minutes, and preferably, it should be spread throughout the week.  Behavioral modification strategies: increasing lean protein intake, decreasing simple carbohydrates, increasing vegetables, increasing water intake, no skipping meals, meal planning and cooking strategies, keeping healthy foods in the home, and planning for success.  Anna Holland has agreed to follow-up with our clinic in 4 weeks. She was informed of the importance of frequent follow-up visits to maximize her success with intensive lifestyle modifications for her multiple health conditions.   Objective:   Blood pressure 98/61, pulse 80, temperature 97.6 F (36.4 C), height 5' (1.524 m), weight 202 lb (91.6 kg), last menstrual period 03/07/2023, SpO2 98%. Body mass index is 39.45 kg/m.  General: Cooperative, alert, well developed, in no acute distress. HEENT: Conjunctivae and lids unremarkable. Cardiovascular: Regular rhythm.  Lungs: Normal work of breathing. Neurologic: No focal deficits.   Lab  Results  Component Value Date   CREATININE 0.50 01/07/2023   BUN 10 01/07/2023   NA 137 01/07/2023   K 4.7 01/07/2023   CL 104 01/07/2023   CO2 27 01/07/2023   Lab Results  Component Value Date   ALT  10 01/07/2023   AST 15 01/07/2023   ALKPHOS 72 01/07/2023   BILITOT 0.4 01/07/2023   Lab Results  Component Value Date   HGBA1C 5.1 01/07/2023   HGBA1C 5.4 08/04/2022   HGBA1C 5.2 02/20/2022   HGBA1C 5.1 10/31/2021   HGBA1C 5.4 04/17/2021   Lab Results  Component Value Date   INSULIN 4.8 08/04/2022   INSULIN 8.3 02/20/2022   INSULIN 11.2 10/31/2021   INSULIN 9.0 04/17/2021   INSULIN 23.5 12/20/2020   Lab Results  Component Value Date   TSH 0.63 01/07/2023   Lab Results  Component Value Date   CHOL 160 01/07/2023   HDL 35.70 (L) 01/07/2023   LDLCALC 113 (H) 01/07/2023   LDLDIRECT 126.0 09/02/2017   TRIG 58.0 01/07/2023   CHOLHDL 4 01/07/2023   Lab Results  Component Value Date   VD25OH 22.25 (L) 01/07/2023   VD25OH 40.6 11/24/2022   VD25OH 37.2 08/04/2022   Lab Results  Component Value Date   WBC 7.4 01/07/2023   HGB 12.5 01/07/2023   HCT 38.5 01/07/2023   MCV 83.4 01/07/2023   PLT 376.0 01/07/2023   Lab Results  Component Value Date   IRON 68 11/24/2022   TIBC 305 11/24/2022   FERRITIN 109 11/24/2022   Attestation Statements:   Reviewed by clinician on day of visit: allergies, medications, problem list, medical history, surgical history, family history, social history, and previous encounter notes.  I have reviewed the above documentation for accuracy and completeness, and I agree with the above. -  Ulices Maack d. Mizani Dilday, NP-C

## 2023-03-30 DIAGNOSIS — Z30431 Encounter for routine checking of intrauterine contraceptive device: Secondary | ICD-10-CM | POA: Diagnosis not present

## 2023-03-30 DIAGNOSIS — Z01419 Encounter for gynecological examination (general) (routine) without abnormal findings: Secondary | ICD-10-CM | POA: Diagnosis not present

## 2023-04-20 ENCOUNTER — Ambulatory Visit (INDEPENDENT_AMBULATORY_CARE_PROVIDER_SITE_OTHER): Payer: Commercial Managed Care - PPO | Admitting: Adult Health

## 2023-04-20 ENCOUNTER — Encounter (INDEPENDENT_AMBULATORY_CARE_PROVIDER_SITE_OTHER): Payer: Self-pay | Admitting: Adult Health

## 2023-04-20 VITALS — BP 78/52 | HR 72 | Temp 98.2°F | Ht 60.0 in | Wt 203.0 lb

## 2023-04-20 DIAGNOSIS — E785 Hyperlipidemia, unspecified: Secondary | ICD-10-CM

## 2023-04-20 DIAGNOSIS — E88819 Insulin resistance, unspecified: Secondary | ICD-10-CM

## 2023-04-20 DIAGNOSIS — Z6839 Body mass index (BMI) 39.0-39.9, adult: Secondary | ICD-10-CM | POA: Diagnosis not present

## 2023-04-20 DIAGNOSIS — R031 Nonspecific low blood-pressure reading: Secondary | ICD-10-CM | POA: Diagnosis not present

## 2023-04-20 DIAGNOSIS — E669 Obesity, unspecified: Secondary | ICD-10-CM

## 2023-04-20 DIAGNOSIS — E786 Lipoprotein deficiency: Secondary | ICD-10-CM

## 2023-04-20 NOTE — Progress Notes (Addendum)
 WEIGHT SUMMARY AND BIOMETRICS  Vitals Temp: 98.2 F (36.8 C) BP: (!) 78/52 Pulse Rate: 72 SpO2: 98 %   Anthropometric Measurements Height: 5' (1.524 m) Weight: 203 lb (92.1 kg) BMI (Calculated): 39.65 Weight at Last Visit: 202lb Weight Lost Since Last Visit: 0 Weight Gained Since Last Visit: 1lb Starting Weight: 235lb Total Weight Loss (lbs): 32 lb (14.5 kg) Peak Weight: 235lb   Body Composition  Body Fat %: 44.6 % Fat Mass (lbs): 90.8 lbs Muscle Mass (lbs): 106.8 lbs Total Body Water (lbs): 79.8 lbs Visceral Fat Rating : 10   Other Clinical Data Fasting: no Labs: no Today's Visit #: 50 Starting Date: 12/20/20    Chief Complaint:   OBESITY Anna Holland is here to discuss her progress with her obesity treatment plan. She is on the the Category 1 Plan and states she is following her eating plan approximately 75-80 % of the time.  She states she is exercising YouTube Workout 25 minutes 1 times per week.   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 Hyperlipidemia with low HDL Lipid Panel     Component Value Date/Time   CHOL 160 01/07/2023 1442   CHOL 144 08/04/2022 0749   TRIG 58.0 01/07/2023 1442   HDL 35.70 (L) 01/07/2023 1442   HDL 33 (L) 08/04/2022 0749   CHOLHDL 4 01/07/2023 1442   VLDL 11.6 01/07/2023 1442   LDLCALC 113 (H) 01/07/2023 1442   LDLCALC 97 08/04/2022 0749   LDLDIRECT 126.0 09/02/2017 1452   LABVLDL 14 08/04/2022 0749    She is not on statin therapy  2. Insulin  resistance  Latest Reference Range & Units 12/20/20 09:41 04/17/21 10:39 10/31/21 09:00 02/20/22 11:23 08/04/22 07:49  INSULIN  2.6 - 24.9 uIU/mL 23.5 9.0 11.2 8.3 4.8   She is not currently on any antidiabetic medications. She endorses stable appetite.  3. Low blood pressure  reading Derm prescribes her daily Spironolactone 50mg - acne tx- short Rx She denies sx's  of hypotension She is making a concerted to increase water intake.  Assessment/Plan:   1. Hyperlipidemia with low HDL Increase lean protein and limit sat fat. Increase cardiovascular exercise  2. Insulin  resistance Increase lean protein and limit sugar/simple CHO Increase cardiovascular exercise  3. Low blood pressure reading (Primary) Remain well hydrated Monitor BP Monitor for sx's of hypotension.  4. BMI 39.0-39.9,adult Current BMI 39.65  Anna Holland is currently in the action stage of change. As such, her goal is to continue with weight loss efforts. She has agreed to the Category 1 Plan.   Exercise goals: For substantial health benefits, adults should do at least 150 minutes (2 hours and 30 minutes) a week of moderate-intensity, or 75 minutes (1 hour and 15 minutes) a week of vigorous-intensity aerobic physical activity, or an equivalent combination of moderate- and vigorous-intensity aerobic activity. Aerobic activity should be performed in episodes of at least 10 minutes, and preferably, it should be spread throughout the week.  Behavioral modification strategies: increasing lean protein intake, decreasing simple carbohydrates, increasing vegetables, increasing water intake, meal planning and cooking strategies, keeping healthy foods in the home, ways to avoid boredom eating, ways to avoid night time snacking, and planning for success.  Anna Holland has agreed to follow-up with our clinic in 4 weeks. She was informed of  the importance of frequent follow-up visits to maximize her success with intensive lifestyle modifications for her multiple health conditions.   Objective:   Blood pressure (!) 78/52, pulse 72, temperature 98.2 F (36.8 C), height 5' (1.524 m), weight 203 lb (92.1 kg), last menstrual period 04/05/2023, SpO2 98%. Body mass index is 39.65 kg/m.  General: Cooperative, alert, well  developed, in no acute distress. HEENT: Conjunctivae and lids unremarkable. Cardiovascular: Regular rhythm.  Lungs: Normal work of breathing. Neurologic: No focal deficits.   Lab Results  Component Value Date   CREATININE 0.50 01/07/2023   BUN 10 01/07/2023   NA 137 01/07/2023   K 4.7 01/07/2023   CL 104 01/07/2023   CO2 27 01/07/2023   Lab Results  Component Value Date   ALT 10 01/07/2023   AST 15 01/07/2023   ALKPHOS 72 01/07/2023   BILITOT 0.4 01/07/2023   Lab Results  Component Value Date   HGBA1C 5.1 01/07/2023   HGBA1C 5.4 08/04/2022   HGBA1C 5.2 02/20/2022   HGBA1C 5.1 10/31/2021   HGBA1C 5.4 04/17/2021   Lab Results  Component Value Date   INSULIN  4.8 08/04/2022   INSULIN  8.3 02/20/2022   INSULIN  11.2 10/31/2021   INSULIN  9.0 04/17/2021   INSULIN  23.5 12/20/2020   Lab Results  Component Value Date   TSH 0.63 01/07/2023   Lab Results  Component Value Date   CHOL 160 01/07/2023   HDL 35.70 (L) 01/07/2023   LDLCALC 113 (H) 01/07/2023   LDLDIRECT 126.0 09/02/2017   TRIG 58.0 01/07/2023   CHOLHDL 4 01/07/2023   Lab Results  Component Value Date   VD25OH 22.25 (L) 01/07/2023   VD25OH 40.6 11/24/2022   VD25OH 37.2 08/04/2022   Lab Results  Component Value Date   WBC 7.4 01/07/2023   HGB 12.5 01/07/2023   HCT 38.5 01/07/2023   MCV 83.4 01/07/2023   PLT 376.0 01/07/2023   Lab Results  Component Value Date   IRON 68 11/24/2022   TIBC 305 11/24/2022   FERRITIN 109 11/24/2022    Attestation Statements:   Reviewed by clinician on day of visit: allergies, medications, problem list, medical history, surgical history, family history, social history, and previous encounter notes.  Time spent on visit including pre-visit chart review and post-visit care and charting was 27 minutes.   I have reviewed the above documentation for accuracy and completeness, and I agree with the above. -  Bayler Nehring d. Orlandis Sanden, NP-C

## 2023-04-20 NOTE — Addendum Note (Signed)
 Addended by: Joylene John A on: 04/20/2023 04:32 PM   Modules accepted: Orders

## 2023-05-19 ENCOUNTER — Ambulatory Visit (INDEPENDENT_AMBULATORY_CARE_PROVIDER_SITE_OTHER): Payer: Commercial Managed Care - PPO | Admitting: Adult Health

## 2023-05-19 ENCOUNTER — Encounter (INDEPENDENT_AMBULATORY_CARE_PROVIDER_SITE_OTHER): Payer: Self-pay | Admitting: Adult Health

## 2023-05-19 VITALS — BP 136/78 | HR 75 | Temp 98.1°F | Ht 60.0 in | Wt 203.0 lb

## 2023-05-19 DIAGNOSIS — E785 Hyperlipidemia, unspecified: Secondary | ICD-10-CM | POA: Diagnosis not present

## 2023-05-19 DIAGNOSIS — L700 Acne vulgaris: Secondary | ICD-10-CM

## 2023-05-19 DIAGNOSIS — E786 Lipoprotein deficiency: Secondary | ICD-10-CM

## 2023-05-19 DIAGNOSIS — E669 Obesity, unspecified: Secondary | ICD-10-CM | POA: Diagnosis not present

## 2023-05-19 DIAGNOSIS — R031 Nonspecific low blood-pressure reading: Secondary | ICD-10-CM | POA: Diagnosis not present

## 2023-05-19 DIAGNOSIS — Z6839 Body mass index (BMI) 39.0-39.9, adult: Secondary | ICD-10-CM | POA: Diagnosis not present

## 2023-05-19 DIAGNOSIS — E88819 Insulin resistance, unspecified: Secondary | ICD-10-CM | POA: Diagnosis not present

## 2023-05-19 DIAGNOSIS — E559 Vitamin D deficiency, unspecified: Secondary | ICD-10-CM | POA: Diagnosis not present

## 2023-05-19 NOTE — Progress Notes (Signed)
WEIGHT SUMMARY AND BIOMETRICS  Vitals Temp: 98.1 F (36.7 C) BP: 136/78 Pulse Rate: 75 SpO2: 99 %   Anthropometric Measurements Height: 5' (1.524 m) Weight: 203 lb (92.1 kg) BMI (Calculated): 39.65 Weight at Last Visit: 203lb Weight Lost Since Last Visit: 0lb Weight Gained Since Last Visit: 0lb Starting Weight: 235lb Total Weight Loss (lbs): 32 lb (14.5 kg)   Body Composition  Body Fat %: 44.8 % Fat Mass (lbs): 91.4 lbs Muscle Mass (lbs): 106.8 lbs Total Body Water (lbs): 80.6 lbs Visceral Fat Rating : 10   Other Clinical Data Fasting: No Labs: No Today's Visit #: 34 Starting Date: 12/20/20    Chief Complaint:   OBESITY Anna Holland is here to discuss her progress with her obesity treatment plan. She is on the the Category 1 Plan and states she is following her eating plan approximately 80 % of the time.  She states she is exercising Cardiovascular Exercise and Strength Training 45 minutes 3 times per week.   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  She struggles with fatigue when her shifts are split up with several days in between work.  Exercise-She has resumed regular exercise- Runner, broadcasting/film/video and Cardiovascular Exercise.    She will start with weights then finish either stair master or treadmill.  Hydration-she estimates to drink at least 60-80 oz water/day  Subjective:   1. Low blood pressure reading BP improved and even slightly elevated at OV She is on Spironolactone 50mg  BID per Derm She has been on since 02/18/2023  2. Insulin resistance  Latest Reference Range & Units 12/20/20 09:41 04/17/21 10:39 10/31/21 09:00 02/20/22 11:23 08/04/22 07:49  INSULIN 2.6 - 24.9 uIU/mL 23.5 9.0 11.2 8.3 4.8   She is not currently on any antidiabetic therapy at present  3. Vitamin D deficiency  Latest Reference Range & Units 01/07/23 14:42  VITD 30.00 - 100.00 ng/mL 22.25  (L)  (L): Data is abnormally low She is on weekly Ergocalciferol- denies N/V/Muscle Weakness  4. 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 Acne vulgaris Short course of Spironolactone 50mg  BID per Derm Rx initially provided on 02/16/2023, she started Rx on 02/17/2023  Assessment/Plan:   1. Low blood pressure reading Remain well hydrated and continue Spironolactone per Derm  2. Insulin resistance Increase protein and limit sugar/simple CHO Continue regular exercise  3. Vitamin D deficiency PCP refilled weekly Ergocalciferol  4. Hyperlipidemia with low HDL Continue to limit sat fat and regular cardiovascular exercise  5. Acne vulgaris Remain well hydrated Continue K+ sparing diuretic per Derm  6. BMI 39.0-39.9,adult Current BMI 39.65 (Primary)  Anna Holland is currently in the action stage of change. As such, her goal is to continue with weight loss efforts. She has agreed to the Category 1 Plan.   Exercise goals: For substantial health benefits, adults should do at least  150 minutes (2 hours and 30 minutes) a week of moderate-intensity, or 75 minutes (1 hour and 15 minutes) a week of vigorous-intensity aerobic physical activity, or an equivalent combination of moderate- and vigorous-intensity aerobic activity. Aerobic activity should be performed in episodes of at least 10 minutes, and preferably, it should be spread throughout the week.  Behavioral modification strategies: increasing lean protein intake,  decreasing simple carbohydrates, increasing vegetables, increasing water intake, no skipping meals, meal planning and cooking strategies, keeping healthy foods in the home, ways to avoid boredom eating, and planning for success.  Anna Holland has agreed to follow-up with our clinic in 4 weeks. She was informed of the importance of frequent follow-up visits to maximize her success with intensive lifestyle modifications for her multiple health conditions.   Check Fasting Labs at next OV   Objective:   Blood pressure 136/78, pulse 75, temperature 98.1 F (36.7 C), height 5' (1.524 m), weight 203 lb (92.1 kg), last menstrual period 05/12/2023, SpO2 99%. Body mass index is 39.65 kg/m.  General: Cooperative, alert, well developed, in no acute distress. HEENT: Conjunctivae and lids unremarkable. Cardiovascular: Regular rhythm.  Lungs: Normal work of breathing. Neurologic: No focal deficits.   Lab Results  Component Value Date   CREATININE 0.50 01/07/2023   BUN 10 01/07/2023   NA 137 01/07/2023   K 4.7 01/07/2023   CL 104 01/07/2023   CO2 27 01/07/2023   Lab Results  Component Value Date   ALT 10 01/07/2023   AST 15 01/07/2023   ALKPHOS 72 01/07/2023   BILITOT 0.4 01/07/2023   Lab Results  Component Value Date   HGBA1C 5.1 01/07/2023   HGBA1C 5.4 08/04/2022   HGBA1C 5.2 02/20/2022   HGBA1C 5.1 10/31/2021   HGBA1C 5.4 04/17/2021   Lab Results  Component Value Date   INSULIN 4.8 08/04/2022   INSULIN 8.3 02/20/2022   INSULIN 11.2 10/31/2021   INSULIN 9.0 04/17/2021   INSULIN 23.5 12/20/2020   Lab Results  Component Value Date   TSH 0.63 01/07/2023   Lab Results  Component Value Date   CHOL 160 01/07/2023   HDL 35.70 (L) 01/07/2023   LDLCALC 113 (H) 01/07/2023   LDLDIRECT 126.0 09/02/2017   TRIG 58.0 01/07/2023   CHOLHDL 4 01/07/2023   Lab Results  Component Value Date   VD25OH 22.25 (L) 01/07/2023   VD25OH 40.6 11/24/2022   VD25OH 37.2 08/04/2022   Lab Results   Component Value Date   WBC 7.4 01/07/2023   HGB 12.5 01/07/2023   HCT 38.5 01/07/2023   MCV 83.4 01/07/2023   PLT 376.0 01/07/2023   Lab Results  Component Value Date   IRON 68 11/24/2022   TIBC 305 11/24/2022   FERRITIN 109 11/24/2022   Attestation Statements:   Reviewed by clinician on day of visit: allergies, medications, problem list, medical history, surgical history, family history, social history, and previous encounter notes.  Time spent on visit including pre-visit chart review and post-visit care and charting was 28 minutes.   I have reviewed the above documentation for accuracy and completeness, and I agree with the above. -  Anna Holland d. Taneesha Edgin, NP-C

## 2023-05-20 DIAGNOSIS — L700 Acne vulgaris: Secondary | ICD-10-CM | POA: Diagnosis not present

## 2023-05-20 DIAGNOSIS — L810 Postinflammatory hyperpigmentation: Secondary | ICD-10-CM | POA: Diagnosis not present

## 2023-06-04 ENCOUNTER — Encounter: Payer: Self-pay | Admitting: Family Medicine

## 2023-06-04 ENCOUNTER — Other Ambulatory Visit: Payer: Self-pay

## 2023-06-04 MED ORDER — PANTOPRAZOLE SODIUM 40 MG PO TBEC
40.0000 mg | DELAYED_RELEASE_TABLET | Freq: Every day | ORAL | 1 refills | Status: DC
  Filled 2023-06-04: qty 90, 90d supply, fill #0
  Filled 2023-09-02: qty 90, 90d supply, fill #1

## 2023-06-05 ENCOUNTER — Other Ambulatory Visit (HOSPITAL_BASED_OUTPATIENT_CLINIC_OR_DEPARTMENT_OTHER): Payer: Self-pay

## 2023-06-22 ENCOUNTER — Ambulatory Visit (INDEPENDENT_AMBULATORY_CARE_PROVIDER_SITE_OTHER): Payer: Commercial Managed Care - PPO | Admitting: Adult Health

## 2023-06-22 ENCOUNTER — Encounter (INDEPENDENT_AMBULATORY_CARE_PROVIDER_SITE_OTHER): Payer: Self-pay | Admitting: Adult Health

## 2023-06-22 VITALS — BP 132/80 | HR 58 | Temp 98.6°F | Ht 60.0 in | Wt 199.0 lb

## 2023-06-22 DIAGNOSIS — E88819 Insulin resistance, unspecified: Secondary | ICD-10-CM

## 2023-06-22 DIAGNOSIS — E559 Vitamin D deficiency, unspecified: Secondary | ICD-10-CM | POA: Diagnosis not present

## 2023-06-22 DIAGNOSIS — Z0000 Encounter for general adult medical examination without abnormal findings: Secondary | ICD-10-CM | POA: Diagnosis not present

## 2023-06-22 DIAGNOSIS — Z6839 Body mass index (BMI) 39.0-39.9, adult: Secondary | ICD-10-CM | POA: Diagnosis not present

## 2023-06-22 DIAGNOSIS — L700 Acne vulgaris: Secondary | ICD-10-CM

## 2023-06-22 DIAGNOSIS — E669 Obesity, unspecified: Secondary | ICD-10-CM

## 2023-06-22 DIAGNOSIS — E786 Lipoprotein deficiency: Secondary | ICD-10-CM

## 2023-06-22 DIAGNOSIS — E785 Hyperlipidemia, unspecified: Secondary | ICD-10-CM

## 2023-06-22 NOTE — Progress Notes (Signed)
 WEIGHT SUMMARY AND BIOMETRICS  Vitals Temp: 98.6 F (37 C) BP: 132/80 Pulse Rate: (!) 58 SpO2: 96 %   Anthropometric Measurements Height: 5' (1.524 m) Weight: 199 lb (90.3 kg) BMI (Calculated): 38.86 Weight at Last Visit: 203 lb Weight Lost Since Last Visit: 4 lb Weight Gained Since Last Visit: 0 lb Starting Weight: 235 lb Total Weight Loss (lbs): 36 lb (16.3 kg)   Body Composition  Body Fat %: 44.1 % Fat Mass (lbs): 88.2 lbs Muscle Mass (lbs): 106 lbs Total Body Water (lbs): 78 lbs Visceral Fat Rating : 10   Other Clinical Data Fasting: Yes Labs: Yes Today's Visit #: 35 Starting Date: 12/20/20    Chief Complaint:   OBESITY Anna Holland is here to discuss her progress with her obesity treatment plan.  She is on the the Category 2 Plan and states she is following her eating plan approximately 40 % of the time.  She states she is exercising Cardiovascular Exercise and Strength Training 45 minutes 1-2 times per week.   Interim History:  She has found that exercising immediately after a 12 hr night shift has worked well the last several weeks. 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 Healthcare maintenance Derm manages Spironolactone 50mg  BID  BP stable at OV  2. Vitamin D deficiency  Latest Reference Range & Units 01/07/23 14:42  VITD 30.00 - 100.00 ng/mL 22.25 (L)  (L): Data is abnormally low  She is on weekly Ergocalciferol- denies N/V/Muscle Weakness  3. Insulin resistance  Latest Reference Range & Units 03/10/22 00:54 08/04/22 07:49 11/24/22 15:56 01/07/23 14:42  Glucose 70 - 99 mg/dL 784 (H) 78 79 82  Hemoglobin A1C 4.6 - 6.5 %  5.4  5.1  Est. average glucose Bld gHb Est-mCnc mg/dL  696    INSULIN 2.6 - 29.5 uIU/mL  4.8    (H): Data is abnormally  high  She denies polyphagia She is not on any antidiabetic medications currently  4. Hyperlipidemia with low HDL Lipid Panel     Component Value Date/Time   CHOL 160 01/07/2023 1442   CHOL 144 08/04/2022 0749   TRIG 58.0 01/07/2023 1442   HDL 35.70 (L) 01/07/2023 1442   HDL 33 (L) 08/04/2022 0749   CHOLHDL 4 01/07/2023 1442   VLDL 11.6 01/07/2023 1442   LDLCALC 113 (H) 01/07/2023 1442   LDLCALC 97 08/04/2022 0749   LDLDIRECT 126.0 09/02/2017 1452   LABVLDL 14 08/04/2022 0749    She is not on statin therapy She denies tobacco/vape use  5. Acne vulgaris Derm manages Spironolactone 50mg  BID  BP stable at OV  Assessment/Plan:   1. Healthcare maintenance Check Labs - VITAMIN D 25 Hydroxy (Vit-D Deficiency, Fractures)  2. Vitamin D deficiency (Primary) Check Labs -VITAMIN D 25 Hydroxy (Vit-D Deficiency, Fractures)   3. Insulin resistance Check Labs - Hemoglobin A1c - Insulin, random  4. Hyperlipidemia with low HDL Check Labs - Lipid panel  5. Acne vulgaris Check Labs - Comprehensive metabolic panel  6. BMI 39.0-39.9,adult Current BMI 39.0  Cadie is currently in the action stage of change. As such, her goal is to continue with weight loss efforts. She has agreed to the Category 2 Plan.   Exercise goals: For substantial health  benefits, adults should do at least 150 minutes (2 hours and 30 minutes) a week of moderate-intensity, or 75 minutes (1 hour and 15 minutes) a week of vigorous-intensity aerobic physical activity, or an equivalent combination of moderate- and vigorous-intensity aerobic activity. Aerobic activity should be performed in episodes of at least 10 minutes, and preferably, it should be spread throughout the week.  Behavioral modification strategies: increasing lean protein intake, decreasing simple carbohydrates, increasing vegetables, increasing water intake, no skipping meals, meal planning and cooking strategies, keeping healthy foods in the home,  ways to avoid boredom eating, better snacking choices, and planning for success.  Alisi has agreed to follow-up with our clinic in 4 weeks. She was informed of the importance of frequent follow-up visits to maximize her success with intensive lifestyle modifications for her multiple health conditions.   Briza was informed we would discuss her lab results at her next visit unless there is a critical issue that needs to be addressed sooner. Caleyah agreed to keep her next visit at the agreed upon time to discuss these results.  Check IC at next OV- pt aware to arrive 30 mins early and to be fasting (only water)  Objective:   Blood pressure 132/80, pulse (!) 58, temperature 98.6 F (37 C), height 5' (1.524 m), weight 199 lb (90.3 kg), last menstrual period 06/19/2023, SpO2 96%. Body mass index is 38.86 kg/m.  General: Cooperative, alert, well developed, in no acute distress. HEENT: Conjunctivae and lids unremarkable. Cardiovascular: Regular rhythm.  Lungs: Normal work of breathing. Neurologic: No focal deficits.   Lab Results  Component Value Date   CREATININE 0.50 01/07/2023   BUN 10 01/07/2023   NA 137 01/07/2023   K 4.7 01/07/2023   CL 104 01/07/2023   CO2 27 01/07/2023   Lab Results  Component Value Date   ALT 10 01/07/2023   AST 15 01/07/2023   ALKPHOS 72 01/07/2023   BILITOT 0.4 01/07/2023   Lab Results  Component Value Date   HGBA1C 5.1 01/07/2023   HGBA1C 5.4 08/04/2022   HGBA1C 5.2 02/20/2022   HGBA1C 5.1 10/31/2021   HGBA1C 5.4 04/17/2021   Lab Results  Component Value Date   INSULIN 4.8 08/04/2022   INSULIN 8.3 02/20/2022   INSULIN 11.2 10/31/2021   INSULIN 9.0 04/17/2021   INSULIN 23.5 12/20/2020   Lab Results  Component Value Date   TSH 0.63 01/07/2023   Lab Results  Component Value Date   CHOL 160 01/07/2023   HDL 35.70 (L) 01/07/2023   LDLCALC 113 (H) 01/07/2023   LDLDIRECT 126.0 09/02/2017   TRIG 58.0 01/07/2023   CHOLHDL 4 01/07/2023    Lab Results  Component Value Date   VD25OH 22.25 (L) 01/07/2023   VD25OH 40.6 11/24/2022   VD25OH 37.2 08/04/2022   Lab Results  Component Value Date   WBC 7.4 01/07/2023   HGB 12.5 01/07/2023   HCT 38.5 01/07/2023   MCV 83.4 01/07/2023   PLT 376.0 01/07/2023   Lab Results  Component Value Date   IRON 68 11/24/2022   TIBC 305 11/24/2022   FERRITIN 109 11/24/2022   Attestation Statements:   Reviewed by clinician on day of visit: allergies, medications, problem list, medical history, surgical history, family history, social history, and previous encounter notes.  I have reviewed the above documentation for accuracy and completeness, and I agree with the above. -  Annakate Soulier d. Railynn Ballo ,NP-C

## 2023-06-23 LAB — COMPREHENSIVE METABOLIC PANEL
ALT: 11 IU/L (ref 0–32)
AST: 16 IU/L (ref 0–40)
Albumin: 4.2 g/dL (ref 4.0–5.0)
Alkaline Phosphatase: 92 IU/L (ref 44–121)
BUN/Creatinine Ratio: 18 (ref 9–23)
BUN: 12 mg/dL (ref 6–20)
Bilirubin Total: 0.3 mg/dL (ref 0.0–1.2)
CO2: 23 mmol/L (ref 20–29)
Calcium: 9.7 mg/dL (ref 8.7–10.2)
Chloride: 101 mmol/L (ref 96–106)
Creatinine, Ser: 0.65 mg/dL (ref 0.57–1.00)
Globulin, Total: 3 g/dL (ref 1.5–4.5)
Glucose: 81 mg/dL (ref 70–99)
Potassium: 4.6 mmol/L (ref 3.5–5.2)
Sodium: 139 mmol/L (ref 134–144)
Total Protein: 7.2 g/dL (ref 6.0–8.5)
eGFR: 126 mL/min/{1.73_m2} (ref >59)

## 2023-06-23 LAB — LIPID PANEL
Chol/HDL Ratio: 4.5 ratio — ABNORMAL HIGH (ref 0.0–4.4)
Cholesterol, Total: 163 mg/dL (ref 100–199)
HDL: 36 mg/dL — ABNORMAL LOW (ref >39)
LDL Chol Calc (NIH): 116 mg/dL — ABNORMAL HIGH (ref 0–99)
Triglycerides: 54 mg/dL (ref 0–149)
VLDL Cholesterol Cal: 11 mg/dL (ref 5–40)

## 2023-06-23 LAB — HEMOGLOBIN A1C
Est. average glucose Bld gHb Est-mCnc: 105 mg/dL
Hgb A1c MFr Bld: 5.3 % (ref 4.8–5.6)

## 2023-06-23 LAB — VITAMIN D 25 HYDROXY (VIT D DEFICIENCY, FRACTURES): Vit D, 25-Hydroxy: 61.6 ng/mL (ref 30.0–100.0)

## 2023-06-23 LAB — INSULIN, RANDOM: INSULIN: 11.7 u[IU]/mL (ref 2.6–24.9)

## 2023-08-05 ENCOUNTER — Ambulatory Visit (INDEPENDENT_AMBULATORY_CARE_PROVIDER_SITE_OTHER): Admitting: Adult Health

## 2023-08-05 ENCOUNTER — Encounter (INDEPENDENT_AMBULATORY_CARE_PROVIDER_SITE_OTHER): Payer: Self-pay | Admitting: Adult Health

## 2023-08-05 ENCOUNTER — Other Ambulatory Visit (HOSPITAL_BASED_OUTPATIENT_CLINIC_OR_DEPARTMENT_OTHER): Payer: Self-pay

## 2023-08-05 VITALS — BP 107/58 | HR 65 | Temp 98.5°F | Ht 60.0 in | Wt 197.0 lb

## 2023-08-05 DIAGNOSIS — E559 Vitamin D deficiency, unspecified: Secondary | ICD-10-CM

## 2023-08-05 DIAGNOSIS — R0602 Shortness of breath: Secondary | ICD-10-CM

## 2023-08-05 DIAGNOSIS — Z6838 Body mass index (BMI) 38.0-38.9, adult: Secondary | ICD-10-CM | POA: Diagnosis not present

## 2023-08-05 DIAGNOSIS — E669 Obesity, unspecified: Secondary | ICD-10-CM

## 2023-08-05 DIAGNOSIS — E785 Hyperlipidemia, unspecified: Secondary | ICD-10-CM | POA: Diagnosis not present

## 2023-08-05 DIAGNOSIS — E786 Lipoprotein deficiency: Secondary | ICD-10-CM | POA: Diagnosis not present

## 2023-08-05 DIAGNOSIS — E88819 Insulin resistance, unspecified: Secondary | ICD-10-CM | POA: Diagnosis not present

## 2023-08-05 DIAGNOSIS — I959 Hypotension, unspecified: Secondary | ICD-10-CM | POA: Diagnosis not present

## 2023-08-05 DIAGNOSIS — E66813 Obesity, class 3: Secondary | ICD-10-CM

## 2023-08-05 MED ORDER — VITAMIN D (ERGOCALCIFEROL) 1.25 MG (50000 UNIT) PO CAPS
50000.0000 [IU] | ORAL_CAPSULE | ORAL | 0 refills | Status: DC
  Filled 2023-08-05 – 2023-09-02 (×2): qty 4, 56d supply, fill #0

## 2023-08-05 NOTE — Progress Notes (Signed)
 WEIGHT SUMMARY AND BIOMETRICS  Vitals Temp: 98.5 F (36.9 C) BP: (!) 107/58 Pulse Rate: 65 SpO2: 97 %   Anthropometric Measurements Height: 5' (1.524 m) Weight: 197 lb (89.4 kg) BMI (Calculated): 38.47 Weight at Last Visit: 199 lb Weight Lost Since Last Visit: 2 lb Weight Gained Since Last Visit: 0 Starting Weight: 235 lb Total Weight Loss (lbs): 38 lb (17.2 kg)   Body Composition  Body Fat %: 43.2 % Fat Mass (lbs): 85.2 lbs Muscle Mass (lbs): 106.2 lbs Total Body Water (lbs): 78.6 lbs Visceral Fat Rating : 9   Other Clinical Data RMR: 1598 Fasting: yes Labs: yes Today's Visit #: 36 Starting Date: 12/20/20    Chief Complaint:   OBESITY Anna Holland is here to discuss her progress with her obesity treatment plan.  She is on the the Category 2 Plan and states she is following her eating plan approximately 75-80 % of the time.  She states she is exercising NEAT Activities.  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  She is able to fuel and hydrate during her shifts.  She has lost 40 lbs! She is looking forward to a trip to [place] in August 2025  Subjective:   1. Shortness of breath She endorses dyspnea with exertion, denies CP  12/20/20 08:00  Height 5' (1.524 m)  Weight 235 lb (106.6 kg)  BMI (Calculated) 45.9  Systolic BP 120  Diastolic BP 80  Pulse Rate 77  RMR 1526   Sept 2022 RMR slower than expected   08/05/23 12:00  Weight 197 lb (89.4 kg)  BMI (Calculated) 38.47  Total Weight Loss (lbs) 38 lb (17.2 kg)  Systolic BP 107 !  Diastolic BP 58 !  Pulse Rate 65  Fasting yes  Labs yes  Today's Visit # 36  Weight at Last Visit 199 lb  Weight Lost Since Last Visit 2 lb  RMR 1598   RMR today slightly improved and almost at anticipated rate  2. Hypotension, unspecified  hypotension type Discussed Labs  3. Vitamin D  deficiency Discussed Labs  Latest Reference Range & Units 06/22/23 10:05  Vitamin D , 25-Hydroxy 30.0 - 100.0 ng/mL 61.6   She is on weekly Ergocalciferol - denies N/V/Muscle Weakness Vit D level at goal  4. Hyperlipidemia with low HDL Discussed Labs Lipid Panel     Component Value Date/Time   CHOL 163 06/22/2023 1005   TRIG 54 06/22/2023 1005   HDL 36 (L) 06/22/2023 1005   CHOLHDL 4.5 (H) 06/22/2023 1005   CHOLHDL 4 01/07/2023 1442   VLDL 11.6 01/07/2023 1442   LDLCALC 116 (H) 06/22/2023 1005   LDLDIRECT 126.0 09/02/2017 1452   LABVLDL 11 06/22/2023 1005    HDL below goal and LDL above goal. She endorses family hx of low serum HDL  5. Insulin  Resistance Discussed Labs  Latest Reference Range & Units 06/22/23 10:05  Glucose 70 - 99 mg/dL 81  Hemoglobin Z6X 4.8 - 5.6 % 5.3  Est. average glucose Bld gHb Est-mCnc mg/dL 096  INSULIN  2.6 - 24.9 uIU/mL 11.7   CBG and A1c at goal Insulin  level worsened and only slightly above goal She is not on any antidiabetic medications  Assessment/Plan:   1. Shortness of breath Continue current Cat 2 Meal Plan Increase cardiovascular exercise  2. Hypotension,  unspecified hypotension type Increase water intake Add a few shakes of Na+ on foods  3. Vitamin D  deficiency Refill and REDUCE - Vitamin D , Ergocalciferol , (DRISDOL ) 1.25 MG (50000 UNIT) CAPS capsule; Take 1 capsule (50,000 Units total) by mouth every 14 (fourteen) days.  Dispense: 4 capsule; Refill: 0  4. Hyperlipidemia with low HDL Increase cardiovascular exercise  5. Insulin  Resistance Increase cardiovascular exercise Limit sugar/simple CHO Increase lean protein intake  6. Obesity with current BMI of 38.5 (Primary)  Anna Holland is currently in the action stage of change. As such, her goal is to continue with weight loss efforts. She has agreed to the Category 2 Plan.   Exercise goals: All adults should avoid inactivity.  Some physical activity is better than none, and adults who participate in any amount of physical activity gain some health benefits. Adults should also include muscle-strengthening activities that involve all major muscle groups on 2 or more days a week.  Behavioral modification strategies: increasing lean protein intake, decreasing simple carbohydrates, increasing vegetables, increasing water intake, decreasing eating out, no skipping meals, meal planning and cooking strategies, keeping healthy foods in the home, and planning for success.  Anna Holland has agreed to follow-up with our clinic in 4 weeks. She was informed of the importance of frequent follow-up visits to maximize her success with intensive lifestyle modifications for her multiple health conditions.   Objective:   Blood pressure (!) 107/58, pulse 65, temperature 98.5 F (36.9 C), height 5' (1.524 m), weight 197 lb (89.4 kg), SpO2 97%. Body mass index is 38.47 kg/m.  General: Cooperative, alert, well developed, in no acute distress. HEENT: Conjunctivae and lids unremarkable. Cardiovascular: Regular rhythm.  Lungs: Normal work of breathing. Neurologic: No focal deficits.   Lab Results  Component Value Date   CREATININE 0.65 06/22/2023   BUN 12 06/22/2023   NA 139 06/22/2023   K 4.6 06/22/2023   CL 101 06/22/2023   CO2 23 06/22/2023   Lab Results  Component Value Date   ALT 11 06/22/2023   AST 16 06/22/2023   ALKPHOS 92 06/22/2023   BILITOT 0.3 06/22/2023   Lab Results  Component Value Date   HGBA1C 5.3 06/22/2023   HGBA1C 5.1 01/07/2023   HGBA1C 5.4 08/04/2022   HGBA1C 5.2 02/20/2022   HGBA1C 5.1 10/31/2021   Lab Results  Component Value Date   INSULIN  11.7 06/22/2023   INSULIN  4.8 08/04/2022   INSULIN  8.3 02/20/2022   INSULIN  11.2 10/31/2021   INSULIN  9.0 04/17/2021   Lab Results  Component Value Date   TSH 0.63 01/07/2023   Lab Results  Component Value Date   CHOL 163 06/22/2023   HDL 36 (L)  06/22/2023   LDLCALC 116 (H) 06/22/2023   LDLDIRECT 126.0 09/02/2017   TRIG 54 06/22/2023   CHOLHDL 4.5 (H) 06/22/2023   Lab Results  Component Value Date   VD25OH 61.6 06/22/2023   VD25OH 22.25 (L) 01/07/2023   VD25OH 40.6 11/24/2022   Lab Results  Component Value Date   WBC 7.4 01/07/2023   HGB 12.5 01/07/2023   HCT 38.5 01/07/2023   MCV 83.4 01/07/2023   PLT 376.0 01/07/2023   Lab Results  Component Value Date   IRON 68 11/24/2022   TIBC 305 11/24/2022   FERRITIN 109 11/24/2022    Attestation Statements:   Reviewed by clinician on day of visit: allergies, medications, problem list, medical history, surgical history, family history, social history, and previous encounter notes.  I have reviewed the above documentation for  accuracy and completeness, and I agree with the above. -  Parker Sawatzky d. Tina Gruner, NP-C

## 2023-08-17 ENCOUNTER — Other Ambulatory Visit (HOSPITAL_BASED_OUTPATIENT_CLINIC_OR_DEPARTMENT_OTHER): Payer: Self-pay

## 2023-09-02 ENCOUNTER — Other Ambulatory Visit (HOSPITAL_BASED_OUTPATIENT_CLINIC_OR_DEPARTMENT_OTHER): Payer: Self-pay

## 2023-09-02 ENCOUNTER — Other Ambulatory Visit: Payer: Self-pay

## 2023-09-03 ENCOUNTER — Other Ambulatory Visit (HOSPITAL_BASED_OUTPATIENT_CLINIC_OR_DEPARTMENT_OTHER): Payer: Self-pay

## 2023-09-10 ENCOUNTER — Other Ambulatory Visit (HOSPITAL_COMMUNITY): Payer: Self-pay

## 2023-09-10 ENCOUNTER — Ambulatory Visit (INDEPENDENT_AMBULATORY_CARE_PROVIDER_SITE_OTHER): Admitting: Adult Health

## 2023-09-10 ENCOUNTER — Encounter (INDEPENDENT_AMBULATORY_CARE_PROVIDER_SITE_OTHER): Payer: Self-pay | Admitting: Adult Health

## 2023-09-10 VITALS — BP 94/62 | HR 67 | Temp 97.8°F | Ht 60.0 in | Wt 197.0 lb

## 2023-09-10 DIAGNOSIS — E559 Vitamin D deficiency, unspecified: Secondary | ICD-10-CM | POA: Diagnosis not present

## 2023-09-10 DIAGNOSIS — E669 Obesity, unspecified: Secondary | ICD-10-CM | POA: Diagnosis not present

## 2023-09-10 DIAGNOSIS — Z6838 Body mass index (BMI) 38.0-38.9, adult: Secondary | ICD-10-CM

## 2023-09-10 DIAGNOSIS — L700 Acne vulgaris: Secondary | ICD-10-CM

## 2023-09-10 DIAGNOSIS — E88819 Insulin resistance, unspecified: Secondary | ICD-10-CM | POA: Diagnosis not present

## 2023-09-10 DIAGNOSIS — E786 Lipoprotein deficiency: Secondary | ICD-10-CM

## 2023-09-10 DIAGNOSIS — E785 Hyperlipidemia, unspecified: Secondary | ICD-10-CM

## 2023-09-10 DIAGNOSIS — Z6841 Body Mass Index (BMI) 40.0 and over, adult: Secondary | ICD-10-CM

## 2023-09-10 NOTE — Progress Notes (Signed)
 WEIGHT SUMMARY AND BIOMETRICS  Vitals Temp: 97.8 F (36.6 C) BP: 94/62 Pulse Rate: 67 SpO2: 99 %   Anthropometric Measurements Height: 5' (1.524 m) Weight: 197 lb (89.4 kg) BMI (Calculated): 38.47 Weight at Last Visit: 197 lb Weight Lost Since Last Visit: 0 Weight Gained Since Last Visit: 0 Starting Weight: 235 lb Total Weight Loss (lbs): 38 lb (17.2 kg)   Body Composition  Body Fat %: 43.7 % Fat Mass (lbs): 86.2 lbs Muscle Mass (lbs): 105.4 lbs Total Body Water (lbs): 79 lbs Visceral Fat Rating : 9   Other Clinical Data RMR: 1598 Fasting: no Labs: no Today's Visit #: 37 Starting Date: 12/20/20    Chief Complaint:   OBESITY Anna Holland is here to discuss her progress with her obesity treatment plan.  She is on the the Category 2 Plan and states she is following her eating plan approximately 75-80 % of the time.  She states she is exercising Strength Training 30-40 minutes 2 times per week.  Interim History:  She has been tracking intake, average calorie count [phone] with average grams of protein 90-100g  Blood Pressure continues to be stable, yet soft She denies sx's of hypotension  Subjective:   1. Vitamin D  deficiency  Latest Reference Range & Units 08/04/22 07:49 11/24/22 15:56 06/22/23 10:05  Vitamin D , 25-Hydroxy 30.0 - 100.0 ng/mL 37.2 40.6 61.6   At last [facility] OV her Ergocalciferol  was reduce from weekly to every other week She endorses stable energy levels  2. Hyperlipidemia with low HDL  Latest Reference Range & Units 08/04/22 07:49 01/07/23 14:42 06/22/23 10:05  HDL Cholesterol >39 mg/dL 33 (L) 16.10 (L) 36 (L)  (L): Data is abnormally low  Both of her parents have HLD  3. 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 Acne vulgaris 05/20/2023 Derm OV Notes: 05/20/2023 Acne vulgaris (ICD-10 - L70.0)   Recommend continuing spironolactone to treat hormonal acne. Will increase dose to 150mg  due to her still flaring at 100mg . Discussed SE of spironolactone including breast tenderness, menstrual irregularities, headaches, dizziness, lightheadedness.   Tretinoin side effects and proper usage: pea size amount for entire face. Can cause flaking, erythema and sensitivity. Mix with moisturizer or use every other day to start. Can cause sensitivity in the sun. Stop if pregnant.       Assessment/Plan:   1. Vitamin D  deficiency Continue bi-weekly Ergocalciferol - denies need for refill today  2. Hyperlipidemia with low HDL (Primary) Continue Strength Training Increase Cardiovascular exercise  3. Insulin  resistance Continue prescribed MP, strive for at least 90g protein per day  4. Acne vulgaris Remain well hydrated Monitor for sx's of hypotension  5. Obesity with current BMI of 38.5  [name] is currently in the action stage of change. As such, her goal is to continue with weight loss efforts. She has agreed to the Category 2 Plan. Journaling 1200 cal/90-100g protein  Exercise goals: For substantial health benefits, adults should do at least 150 minutes (2 hours and 30 minutes) a week of moderate-intensity, or 75 minutes (1 hour and 15 minutes) a week of vigorous-intensity aerobic physical activity, or an equivalent combination of moderate- and vigorous-intensity aerobic activity. Aerobic activity should be performed in episodes of at  least 10 minutes, and preferably, it should be spread throughout the week.  Behavioral modification strategies: increasing lean protein intake, decreasing simple carbohydrates, increasing vegetables, increasing water intake, no skipping meals, meal planning and cooking strategies, keeping healthy foods in the home, ways to avoid boredom eating, and planning for  success.  Anna Holland has agreed to follow-up with our clinic in 4 weeks. She was informed of the importance of frequent follow-up visits to maximize her success with intensive lifestyle modifications for her multiple health conditions.   Objective:   Blood pressure 94/62, pulse 67, temperature 97.8 F (36.6 C), height 5' (1.524 m), weight 197 lb (89.4 kg), SpO2 99%. Body mass index is 38.47 kg/m.  General: Cooperative, alert, well developed, in no acute distress. HEENT: Conjunctivae and lids unremarkable. Cardiovascular: Regular rhythm.  Lungs: Normal work of breathing. Neurologic: No focal deficits.   Lab Results  Component Value Date   CREATININE 0.65 06/22/2023   BUN 12 06/22/2023   NA 139 06/22/2023   K 4.6 06/22/2023   CL 101 06/22/2023   CO2 23 06/22/2023   Lab Results  Component Value Date   ALT 11 06/22/2023   AST 16 06/22/2023   ALKPHOS 92 06/22/2023   BILITOT 0.3 06/22/2023   Lab Results  Component Value Date   HGBA1C 5.3 06/22/2023   HGBA1C 5.1 01/07/2023   HGBA1C 5.4 08/04/2022   HGBA1C 5.2 02/20/2022   HGBA1C 5.1 10/31/2021   Lab Results  Component Value Date   INSULIN  11.7 06/22/2023   INSULIN  4.8 08/04/2022   INSULIN  8.3 02/20/2022   INSULIN  11.2 10/31/2021   INSULIN  9.0 04/17/2021   Lab Results  Component Value Date   TSH 0.63 01/07/2023   Lab Results  Component Value Date   CHOL 163 06/22/2023   HDL 36 (L) 06/22/2023   LDLCALC 116 (H) 06/22/2023   LDLDIRECT 126.0 09/02/2017   TRIG 54 06/22/2023   CHOLHDL 4.5 (H) 06/22/2023   Lab Results  Component Value Date   VD25OH 61.6 06/22/2023   VD25OH 22.25 (L) 01/07/2023   VD25OH 40.6 11/24/2022   Lab Results  Component Value Date   WBC 7.4 01/07/2023   HGB 12.5 01/07/2023   HCT 38.5 01/07/2023   MCV 83.4 01/07/2023   PLT 376.0 01/07/2023   Lab Results  Component Value Date   IRON 68 11/24/2022   TIBC 305 11/24/2022   FERRITIN 109 11/24/2022    Attestation Statements:   Reviewed  by clinician on day of visit: allergies, medications, problem list, medical history, surgical history, family history, social history, and previous encounter notes.  "25 minutes spent face-to-face with the patient discussing management of disordered eating symptoms, reviewing current medications, and providing strategies for coping with emotional eating."  I have reviewed the above documentation for accuracy and completeness, and I agree with the above. -  Cordelro Gautreau d. Infinity Jeffords, NP-C

## 2023-10-20 ENCOUNTER — Ambulatory Visit (INDEPENDENT_AMBULATORY_CARE_PROVIDER_SITE_OTHER): Admitting: Adult Health

## 2023-10-20 ENCOUNTER — Other Ambulatory Visit (HOSPITAL_BASED_OUTPATIENT_CLINIC_OR_DEPARTMENT_OTHER): Payer: Self-pay

## 2023-10-20 DIAGNOSIS — F419 Anxiety disorder, unspecified: Secondary | ICD-10-CM

## 2023-10-20 DIAGNOSIS — E669 Obesity, unspecified: Secondary | ICD-10-CM

## 2023-10-20 DIAGNOSIS — F32A Depression, unspecified: Secondary | ICD-10-CM | POA: Diagnosis not present

## 2023-10-20 DIAGNOSIS — E88819 Insulin resistance, unspecified: Secondary | ICD-10-CM | POA: Diagnosis not present

## 2023-10-20 DIAGNOSIS — Z6841 Body Mass Index (BMI) 40.0 and over, adult: Secondary | ICD-10-CM

## 2023-10-20 DIAGNOSIS — E559 Vitamin D deficiency, unspecified: Secondary | ICD-10-CM | POA: Diagnosis not present

## 2023-10-20 DIAGNOSIS — Z6838 Body mass index (BMI) 38.0-38.9, adult: Secondary | ICD-10-CM | POA: Diagnosis not present

## 2023-10-20 MED ORDER — CITALOPRAM HYDROBROMIDE 10 MG PO TABS
10.0000 mg | ORAL_TABLET | Freq: Every day | ORAL | 0 refills | Status: DC
  Filled 2023-10-20: qty 30, 30d supply, fill #0

## 2023-10-20 NOTE — Progress Notes (Signed)
 WEIGHT SUMMARY AND BIOMETRICS  Vitals Temp: 98.5 F (36.9 C) BP: 105/72 Pulse Rate: 69 SpO2: 98 %   Anthropometric Measurements Height: 5' (1.524 m) Weight: 198 lb (89.8 kg) BMI (Calculated): 38.67 Weight at Last Visit: 197 lb Weight Lost Since Last Visit: 0 Weight Gained Since Last Visit: 1 lb Starting Weight: 235 lb Total Weight Loss (lbs): 37 lb (16.8 kg)   Body Composition  Body Fat %: 40.7 % Fat Mass (lbs): 80.8 lbs Muscle Mass (lbs): 111.6 lbs Total Body Water (lbs): 81.2 lbs Visceral Fat Rating : 9   Other Clinical Data RMR: 1598 Fasting: no Labs: no Today's Visit #: 38 Starting Date: 12/20/20    Chief Complaint:   OBESITY Anna Holland is here to discuss her progress with her obesity treatment plan.  She is on the the Category 2 Plan and states she is following her eating plan approximately 75 % of the time.  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 She walked the beach frequently and enjoyed seafood.  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 She endorses increased stress and anxiety r/t to nursing and patient care.  Subjective:   1. Vitamin D  deficiency  Latest Reference Range & Units 01/07/23 14:42 06/22/23 10:05  Vitamin D , 25-Hydroxy 30.0 - 100.0 ng/mL  61.6  VITD 30.00 - 100.00 ng/mL 22.25 (L)   (L): Data is abnormally low  She has been inconsistently taking bi-weekly Ergocalciferol   2. Insulin  resistance  Latest Reference Range & Units 06/22/23 10:05  Glucose 70 - 99 mg/dL 81  Hemoglobin J8R 4.8 - 5.6 % 5.3  Est. average glucose Bld gHb Est-mCnc mg/dL 894  INSULIN  2.6 - 24.9 uIU/mL 11.7   She is not currently on any antidiabetic  medications She endorses stable appetite  3. Anxiety and depression Anna Holland was previously on daily Wellbutrin  XL 150mg  and Celexa  20mg  She was using these Rx for management of depression, anxiety, and sx's of PTSD. She denise SE with either medication. When she felt that her mood sx's with well controlled, she stopped both medications. She denied withdraw after abruptly stopping.  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 She endorses increased stress and anxiety r/t to nursing and patient care.  She vehemently denies SI/HI She is agreeable to restarting SSRI therapy  Assessment/Plan:   1. Vitamin D  deficiency Refill Vitamin D , Ergocalciferol , (DRISDOL ) 1.25 MG (50000 UNIT) CAPS capsule Take 1 capsule (50,000 Units total) by mouth every 14 (fourteen) days. Dispense: 4 capsule, Refills: 0 of 0 remaining   2. Insulin  resistance Continue healthy eating and increase regular cardiovascular exercise  3. Anxiety and depression Restart  citalopram  (CELEXA ) 10 MG tablet Take 1 tablet (10 mg total) by mouth daily. Dispense: 30 tablet, Refills: 0 ordered   4. Obesity with current BMI of 38.7  Anna Holland is currently in the action stage of change. As such, her goal is to continue with weight loss efforts. She has agreed to the Category 2 Plan.   Exercise goals: All adults should avoid inactivity. Some physical activity is better than none,  and adults who participate in any amount of physical activity gain some health benefits. Adults should also include muscle-strengthening activities that involve all major muscle groups on 2 or more days a week.  Behavioral modification strategies: increasing lean protein intake, decreasing simple carbohydrates, increasing vegetables, increasing water intake, no skipping meals, meal planning and cooking strategies, keeping healthy foods in the home, ways to avoid boredom eating, and  planning for success.  Anna Holland has agreed to follow-up with our clinic in 4 weeks. She was informed of the importance of frequent follow-up visits to maximize her success with intensive lifestyle modifications for her multiple health conditions.   Check Fasting Labs end of summer 2025  Objective:   Blood pressure 105/72, pulse 69, temperature 98.5 F (36.9 C), height 5' (1.524 m), weight 198 lb (89.8 kg), SpO2 98%. Body mass index is 38.67 kg/m.  General: Cooperative, alert, well developed, in no acute distress. HEENT: Conjunctivae and lids unremarkable. Cardiovascular: Regular rhythm.  Lungs: Normal work of breathing. Neurologic: No focal deficits.   Lab Results  Component Value Date   CREATININE 0.65 06/22/2023   BUN 12 06/22/2023   NA 139 06/22/2023   K 4.6 06/22/2023   CL 101 06/22/2023   CO2 23 06/22/2023   Lab Results  Component Value Date   ALT 11 06/22/2023   AST 16 06/22/2023   ALKPHOS 92 06/22/2023   BILITOT 0.3 06/22/2023   Lab Results  Component Value Date   HGBA1C 5.3 06/22/2023   HGBA1C 5.1 01/07/2023   HGBA1C 5.4 08/04/2022   HGBA1C 5.2 02/20/2022   HGBA1C 5.1 10/31/2021   Lab Results  Component Value Date   INSULIN  11.7 06/22/2023   INSULIN  4.8 08/04/2022   INSULIN  8.3 02/20/2022   INSULIN  11.2 10/31/2021   INSULIN  9.0 04/17/2021   Lab Results  Component Value Date   TSH 0.63 01/07/2023   Lab Results  Component Value Date   CHOL 163 06/22/2023   HDL 36 (L) 06/22/2023   LDLCALC 116 (H) 06/22/2023   LDLDIRECT 126.0 09/02/2017   TRIG 54 06/22/2023   CHOLHDL 4.5 (H) 06/22/2023   Lab Results  Component Value Date   VD25OH 61.6 06/22/2023   VD25OH 22.25 (L) 01/07/2023   VD25OH 40.6 11/24/2022   Lab Results  Component Value Date   WBC 7.4 01/07/2023   HGB 12.5 01/07/2023   HCT 38.5 01/07/2023   MCV 83.4 01/07/2023   PLT 376.0 01/07/2023   Lab Results  Component Value Date   IRON 68 11/24/2022   TIBC 305 11/24/2022   FERRITIN  109 11/24/2022   Attestation Statements:   Reviewed by clinician on day of visit: allergies, medications, problem list, medical history, surgical history, family history, social history, and previous encounter notes.  I have reviewed the above documentation for accuracy and completeness, and I agree with the above. -  Shilee Biggs d. Jawaun Celmer, NP-C

## 2023-10-23 ENCOUNTER — Encounter: Payer: Self-pay | Admitting: Advanced Practice Midwife

## 2023-11-29 ENCOUNTER — Other Ambulatory Visit (INDEPENDENT_AMBULATORY_CARE_PROVIDER_SITE_OTHER): Payer: Self-pay | Admitting: Adult Health

## 2023-11-29 ENCOUNTER — Other Ambulatory Visit: Payer: Self-pay | Admitting: Family Medicine

## 2023-11-29 DIAGNOSIS — F32A Depression, unspecified: Secondary | ICD-10-CM

## 2023-11-30 ENCOUNTER — Other Ambulatory Visit: Payer: Self-pay

## 2023-11-30 ENCOUNTER — Encounter (INDEPENDENT_AMBULATORY_CARE_PROVIDER_SITE_OTHER): Payer: Self-pay | Admitting: Adult Health

## 2023-11-30 ENCOUNTER — Other Ambulatory Visit (HOSPITAL_BASED_OUTPATIENT_CLINIC_OR_DEPARTMENT_OTHER): Payer: Self-pay

## 2023-11-30 ENCOUNTER — Ambulatory Visit (INDEPENDENT_AMBULATORY_CARE_PROVIDER_SITE_OTHER): Admitting: Adult Health

## 2023-11-30 VITALS — BP 114/75 | HR 62 | Temp 98.5°F | Ht 60.0 in | Wt 201.0 lb

## 2023-11-30 DIAGNOSIS — E786 Lipoprotein deficiency: Secondary | ICD-10-CM

## 2023-11-30 DIAGNOSIS — E785 Hyperlipidemia, unspecified: Secondary | ICD-10-CM | POA: Diagnosis not present

## 2023-11-30 DIAGNOSIS — F419 Anxiety disorder, unspecified: Secondary | ICD-10-CM | POA: Diagnosis not present

## 2023-11-30 DIAGNOSIS — Z6839 Body mass index (BMI) 39.0-39.9, adult: Secondary | ICD-10-CM | POA: Diagnosis not present

## 2023-11-30 DIAGNOSIS — E559 Vitamin D deficiency, unspecified: Secondary | ICD-10-CM | POA: Diagnosis not present

## 2023-11-30 DIAGNOSIS — I959 Hypotension, unspecified: Secondary | ICD-10-CM | POA: Diagnosis not present

## 2023-11-30 DIAGNOSIS — F32A Depression, unspecified: Secondary | ICD-10-CM | POA: Diagnosis not present

## 2023-11-30 DIAGNOSIS — E66813 Obesity, class 3: Secondary | ICD-10-CM

## 2023-11-30 DIAGNOSIS — E669 Obesity, unspecified: Secondary | ICD-10-CM | POA: Diagnosis not present

## 2023-11-30 DIAGNOSIS — E88819 Insulin resistance, unspecified: Secondary | ICD-10-CM

## 2023-11-30 MED ORDER — CITALOPRAM HYDROBROMIDE 10 MG PO TABS
10.0000 mg | ORAL_TABLET | Freq: Every day | ORAL | 0 refills | Status: DC
  Filled 2023-11-30: qty 30, 30d supply, fill #0

## 2023-11-30 MED ORDER — PANTOPRAZOLE SODIUM 40 MG PO TBEC
40.0000 mg | DELAYED_RELEASE_TABLET | Freq: Every day | ORAL | 1 refills | Status: AC
  Filled 2023-11-30: qty 90, 90d supply, fill #0
  Filled 2024-04-04: qty 90, 90d supply, fill #1

## 2023-11-30 MED ORDER — VITAMIN D (ERGOCALCIFEROL) 1.25 MG (50000 UNIT) PO CAPS
50000.0000 [IU] | ORAL_CAPSULE | ORAL | 0 refills | Status: DC
Start: 2023-11-30 — End: 2024-02-22
  Filled 2023-11-30: qty 4, 56d supply, fill #0

## 2023-11-30 NOTE — Progress Notes (Signed)
 WEIGHT SUMMARY AND BIOMETRICS  Vitals Temp: 98.5 F (36.9 C) BP: 114/75 Pulse Rate: 62 SpO2: 100 %   Anthropometric Measurements Height: 5' (1.524 m) Weight: 198 lb (89.8 kg) BMI (Calculated): 38.67 Weight at Last Visit: 198 lb Weight Lost Since Last Visit: 0 Weight Gained Since Last Visit: 0 Starting Weight: 235lb Total Weight Loss (lbs): 37 lb (16.8 kg)   Body Composition  Body Fat %: 44.1 % Fat Mass (lbs): 89 lbs Muscle Mass (lbs): 107 lbs Total Body Water (lbs): 78.4 lbs Visceral Fat Rating : 10   Other Clinical Data RMR: 1598 Fasting: yes Labs: yes Today's Visit #: 94 Starting Date: 12/20/20    Chief Complaint:   OBESITY Anna Holland is here to discuss her progress with her obesity treatment plan.  She is on the the Category 2 Plan and states she is following her eating plan approximately 75 % of the time.  She states she is exercising Walking her dog 20-30 minutes 2-3 times per week.  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 She was active with walking, water sports. She focused on protein and limiting simple CHO/sugar in foods. She reports consuming more ETOH than usual for her- she is not terribly surprised that her weight has slightly increased  Subjective:   1. Hypotension, unspecified hypotension type BP stable- 114/75 She denies sx's of hypotension Of Note- Acne vulgaris 05/20/2023 Derm OV Notes: 05/20/2023 Acne vulgaris (ICD-10 - L70.0)   Recommend continuing spironolactone to treat hormonal acne. Will increase dose to 150mg  due to her still flaring at 100mg . Discussed SE of spironolactone including breast tenderness, menstrual irregularities, headaches, dizziness, lightheadedness.   Tretinoin side effects and proper usage: pea size amount for entire face. Can cause flaking, erythema and sensitivity. Mix with moisturizer or  use every other day to start. Can cause sensitivity in the sun. Stop if pregnant.    2. Insulin  resistance She endorses stable appeite She reports increased ETOH during her 7 day [place] vacation.  3. Vitamin D  deficiency She is on bi-weekly Ergocalciferol   She denies N/V/Muscle Weakness  4. Hyperlipidemia with low HDL Lipid Panel     Component Value Date/Time   CHOL 163 06/22/2023 1005   TRIG 54 06/22/2023 1005   HDL 36 (L) 06/22/2023 1005   CHOLHDL 4.5 (H) 06/22/2023 1005   CHOLHDL 4 01/07/2023 1442   VLDL 11.6 01/07/2023 1442   LDLCALC 116 (H) 06/22/2023 1005   LDLDIRECT 126.0 09/02/2017 1452   LABVLDL 11 06/22/2023 1005    She has been walking her dog 2-3 times weekly for at least 20-30 mins  5. Anxiety and depression She was recently restarted on daily low dose Celexa  10mg  She denies SI/HI She feels that her mood is stable today  Assessment/Plan:   1. Hypotension, unspecified hypotension type Check Labs - Comprehensive metabolic panel with GFR Remain well hydrated with water  2. Insulin  resistance (Primary) Check Labs - Insulin , random - Hemoglobin A1c  3. Vitamin D  deficiency Check Labs - VITAMIN D  25 Hydroxy (Vit-D Deficiency, Fractures) - Vitamin D , Ergocalciferol , (DRISDOL ) 1.25 MG (50000 UNIT) CAPS capsule; Take 1 capsule (50,000 Units total) by mouth every 14 (fourteen) days.  Dispense: 4 capsule; Refill: 0  4. Hyperlipidemia with low HDL Check Labs - Lipid panel  5. Anxiety and depression Check Labs - TSH + free T4 Refill - citalopram  (CELEXA )  10 MG tablet; Take 1 tablet (10 mg total) by mouth daily.  Dispense: 30 tablet; Refill: 0  6. Obesity with current BMI of 39.4  [name] is currently in the action stage of change. As such, her goal is to continue with weight loss efforts. She has agreed to the Category 2 Plan.   Exercise goals: All adults should avoid inactivity. Some physical activity is better than none, and adults who participate in  any amount of physical activity gain some health benefits. Adults should also include muscle-strengthening activities that involve all major muscle groups on 2 or more days a week.  Behavioral modification strategies: increasing lean protein intake, decreasing simple carbohydrates, increasing vegetables, increasing water intake, no skipping meals, meal planning and cooking strategies, keeping healthy foods in the home, ways to avoid boredom eating, and planning for success.  Dolores has agreed to follow-up with our clinic in 4 weeks. She was informed of the importance of frequent follow-up visits to maximize her success with intensive lifestyle modifications for her multiple health conditions.   Keili was informed we would discuss her lab results at her next visit unless there is a critical issue that needs to be addressed sooner. Audrena agreed to keep her next visit at the agreed upon time to discuss these results.  Objective:   Blood pressure 114/75, pulse 62, temperature 98.5 F (36.9 C), height 5' (1.524 m), weight 198 lb (89.8 kg), last menstrual period 11/16/2023, SpO2 100%. Body mass index is 38.67 kg/m.  General: Cooperative, alert, well developed, in no acute distress. HEENT: Conjunctivae and lids unremarkable. Cardiovascular: Regular rhythm.  Lungs: Normal work of breathing. Neurologic: No focal deficits.   Lab Results  Component Value Date   CREATININE 0.65 06/22/2023   BUN 12 06/22/2023   NA 139 06/22/2023   K 4.6 06/22/2023   CL 101 06/22/2023   CO2 23 06/22/2023   Lab Results  Component Value Date   ALT 11 06/22/2023   AST 16 06/22/2023   ALKPHOS 92 06/22/2023   BILITOT 0.3 06/22/2023   Lab Results  Component Value Date   HGBA1C 5.3 06/22/2023   HGBA1C 5.1 01/07/2023   HGBA1C 5.4 08/04/2022   HGBA1C 5.2 02/20/2022   HGBA1C 5.1 10/31/2021   Lab Results  Component Value Date   INSULIN  11.7 06/22/2023   INSULIN  4.8 08/04/2022   INSULIN  8.3 02/20/2022    INSULIN  11.2 10/31/2021   INSULIN  9.0 04/17/2021   Lab Results  Component Value Date   TSH 0.63 01/07/2023   Lab Results  Component Value Date   CHOL 163 06/22/2023   HDL 36 (L) 06/22/2023   LDLCALC 116 (H) 06/22/2023   LDLDIRECT 126.0 09/02/2017   TRIG 54 06/22/2023   CHOLHDL 4.5 (H) 06/22/2023   Lab Results  Component Value Date   VD25OH 61.6 06/22/2023   VD25OH 22.25 (L) 01/07/2023   VD25OH 40.6 11/24/2022   Lab Results  Component Value Date   WBC 7.4 01/07/2023   HGB 12.5 01/07/2023   HCT 38.5 01/07/2023   MCV 83.4 01/07/2023   PLT 376.0 01/07/2023   Lab Results  Component Value Date   IRON 68 11/24/2022   TIBC 305 11/24/2022   FERRITIN 109 11/24/2022    Attestation Statements:   Reviewed by clinician on day of visit: allergies, medications, problem list, medical history, surgical history, family history, social history, and previous encounter notes.  I have reviewed the above documentation for accuracy and completeness, and I agree with the above. -  Onyx Schirmer d. Riah Kehoe, NP-C

## 2023-12-01 LAB — LIPID PANEL
Chol/HDL Ratio: 4.8 ratio — ABNORMAL HIGH (ref 0.0–4.4)
Cholesterol, Total: 172 mg/dL (ref 100–199)
HDL: 36 mg/dL — ABNORMAL LOW (ref >39)
LDL Chol Calc (NIH): 118 mg/dL — ABNORMAL HIGH (ref 0–99)
Triglycerides: 97 mg/dL (ref 0–149)
VLDL Cholesterol Cal: 18 mg/dL (ref 5–40)

## 2023-12-01 LAB — COMPREHENSIVE METABOLIC PANEL WITH GFR
ALT: 9 IU/L (ref 0–32)
AST: 10 IU/L (ref 0–40)
Albumin: 4.1 g/dL (ref 4.0–5.0)
Alkaline Phosphatase: 86 IU/L (ref 44–121)
BUN/Creatinine Ratio: 20 (ref 9–23)
BUN: 12 mg/dL (ref 6–20)
Bilirubin Total: 0.3 mg/dL (ref 0.0–1.2)
CO2: 21 mmol/L (ref 20–29)
Calcium: 9.4 mg/dL (ref 8.7–10.2)
Chloride: 99 mmol/L (ref 96–106)
Creatinine, Ser: 0.61 mg/dL (ref 0.57–1.00)
Globulin, Total: 3 g/dL (ref 1.5–4.5)
Glucose: 81 mg/dL (ref 70–99)
Potassium: 4.2 mmol/L (ref 3.5–5.2)
Sodium: 137 mmol/L (ref 134–144)
Total Protein: 7.1 g/dL (ref 6.0–8.5)
eGFR: 128 mL/min/1.73 (ref >59)

## 2023-12-01 LAB — HEMOGLOBIN A1C
Est. average glucose Bld gHb Est-mCnc: 105 mg/dL
Hgb A1c MFr Bld: 5.3 % (ref 4.8–5.6)

## 2023-12-01 LAB — INSULIN, RANDOM: INSULIN: 10.4 u[IU]/mL (ref 2.6–24.9)

## 2023-12-01 LAB — TSH+FREE T4
Free T4: 1.2 ng/dL (ref 0.82–1.77)
TSH: 1.43 u[IU]/mL (ref 0.450–4.500)

## 2023-12-29 ENCOUNTER — Ambulatory Visit (INDEPENDENT_AMBULATORY_CARE_PROVIDER_SITE_OTHER): Admitting: Adult Health

## 2023-12-29 ENCOUNTER — Other Ambulatory Visit (HOSPITAL_BASED_OUTPATIENT_CLINIC_OR_DEPARTMENT_OTHER): Payer: Self-pay

## 2023-12-29 ENCOUNTER — Encounter (INDEPENDENT_AMBULATORY_CARE_PROVIDER_SITE_OTHER): Payer: Self-pay | Admitting: Adult Health

## 2023-12-29 VITALS — BP 84/57 | HR 81 | Temp 98.5°F | Ht 60.0 in | Wt 200.0 lb

## 2023-12-29 DIAGNOSIS — E786 Lipoprotein deficiency: Secondary | ICD-10-CM | POA: Diagnosis not present

## 2023-12-29 DIAGNOSIS — E88819 Insulin resistance, unspecified: Secondary | ICD-10-CM

## 2023-12-29 DIAGNOSIS — E669 Obesity, unspecified: Secondary | ICD-10-CM

## 2023-12-29 DIAGNOSIS — F32A Depression, unspecified: Secondary | ICD-10-CM

## 2023-12-29 DIAGNOSIS — I959 Hypotension, unspecified: Secondary | ICD-10-CM

## 2023-12-29 DIAGNOSIS — Z6839 Body mass index (BMI) 39.0-39.9, adult: Secondary | ICD-10-CM

## 2023-12-29 DIAGNOSIS — F419 Anxiety disorder, unspecified: Secondary | ICD-10-CM | POA: Diagnosis not present

## 2023-12-29 DIAGNOSIS — E66813 Obesity, class 3: Secondary | ICD-10-CM

## 2023-12-29 DIAGNOSIS — E559 Vitamin D deficiency, unspecified: Secondary | ICD-10-CM

## 2023-12-29 DIAGNOSIS — E785 Hyperlipidemia, unspecified: Secondary | ICD-10-CM

## 2023-12-29 MED ORDER — CITALOPRAM HYDROBROMIDE 10 MG PO TABS
10.0000 mg | ORAL_TABLET | Freq: Every day | ORAL | 0 refills | Status: DC
  Filled 2023-12-29: qty 30, 30d supply, fill #0

## 2023-12-29 NOTE — Progress Notes (Signed)
 WEIGHT SUMMARY AND BIOMETRICS  Vitals Temp: 98.5 F (36.9 C) BP: (!) 84/57 Pulse Rate: 81 SpO2: 100 %   Anthropometric Measurements Height: 5' (1.524 m) Weight: 200 lb (90.7 kg) BMI (Calculated): 39.06 Weight at Last Visit: 201lb Weight Lost Since Last Visit: 1lb Weight Gained Since Last Visit: 0lb Starting Weight: 235lb Total Weight Loss (lbs): 35 lb (15.9 kg) Peak Weight: 235lb   Body Composition  Body Fat %: 44.9 % Fat Mass (lbs): 90.2 lbs Muscle Mass (lbs): 105 lbs Total Body Water (lbs): 79.8 lbs Visceral Fat Rating : 10   Other Clinical Data RMR: 1598 Fasting: No Labs: No Today's Visit #: 40 Starting Date: 12/20/20    Chief Complaint:   OBESITY Anna Holland is here to discuss her progress with her obesity treatment plan.  She is on the the Category 1 Plan and states she is following her eating plan approximately 75 % of the time.  She states she is exercising Walking 30 minutes 2 times per week.  Interim History:  She recently travelled to [place] over the weekend.  She worked 7p-7a last night. She just awoke to arrive at [facility] today. BP continues to be soft, she denies sx's of hypotension Derm manages Spironolactone   Subjective:   1. Hyperlipidemia with low HDL Discussed Labs  Lipid Panel     Component Value Date/Time   CHOL 172 11/30/2023 1108   TRIG 97 11/30/2023 1108   HDL 36 (L) 11/30/2023 1108   CHOLHDL 4.8 (H) 11/30/2023 1108   CHOLHDL 4 01/07/2023 1442   VLDL 11.6 01/07/2023 1442   LDLCALC 118 (H) 11/30/2023 1108   LDLDIRECT 126.0 09/02/2017 1452   LABVLDL 18 11/30/2023 1108    Tot, Tgs at goal HDL below goal LDL slightly above goal She is not on statin therapy  Her mother and father both have had HDL- on/off statin therapy.  She estimates to consumed 1-2 servings of red meat per week.  2. Insulin  resistance Discussed Labs  Latest Reference Range & Units 11/30/23 11:08  Glucose 70 - 99 mg/dL 81  Hemoglobin  J8R 4.8 - 5.6 % 5.3  Est. average glucose Bld gHb Est-mCnc mg/dL 894  INSULIN  2.6 - 24.9 uIU/mL 10.4   CBG, A1c at goal Insulin  level slightly above goal of 5 She denies polyphagia  She is not antidiabetic medications at present   3. Vitamin D  deficiency She is on bi-weekly Ergocalciferol - denies N/V/Muscle Weakness  4. Hypotension, unspecified hypotension type Discussed Labs Her father also had chronic hypotension 11/30/2023 CMP: Electrolytes, Liver Enzymes, and Kidney Fx- normal Derm manages Spironolactone   5. Anxiety and depression Discussed Labs  Latest Reference Range & Units 11/30/23 11:08  TSH 0.450 - 4.500 uIU/mL 1.430  T4,Free(Direct) 0.82 - 1.77 ng/dL 8.79   2/84/7974 restarted on daily low dose Celexa  10mg  She reports stable mood, denies SI/HI She reports noticeable reduction of overall anxiety levels  Assessment/Plan:   1. Hyperlipidemia with low HDL Reduce red meat intake to once per week/max  2. Insulin  resistance (Primary) Continue healthy eating and regular exercise  3. Vitamin D  deficiency Continue bi-weekly Ergocalciferol - denies need for refill at this time  4. Hypotension, unspecified hypotension type Increase water Add a few shakes of Na+ onto food Monitor for sx's of hypotension  5. Anxiety and depression Refill - citalopram  (CELEXA ) 10 MG tablet; Take 1 tablet (10 mg total) by mouth daily.  Dispense: 30 tablet; Refill: 0  6. Obesity with current BMI  of 39.4  Dalinda is currently in the action stage of change. As such, her goal is to maintain weight for now. She has agreed to the Category 1 Plan.   Exercise goals: For substantial health benefits, adults should do at least 150 minutes (2 hours and 30 minutes) a week of moderate-intensity, or 75 minutes (1 hour and 15 minutes) a week of vigorous-intensity aerobic physical activity, or an equivalent combination of moderate- and vigorous-intensity aerobic activity. Aerobic activity should be  performed in episodes of at least 10 minutes, and preferably, it should be spread throughout the week.  Behavioral modification strategies: increasing lean protein intake, decreasing simple carbohydrates, increasing vegetables, increasing water intake, meal planning and cooking strategies, keeping healthy foods in the home, ways to avoid boredom eating, ways to avoid night time snacking, and planning for success.  Adilen has agreed to follow-up with our clinic in 4 weeks. She was informed of the importance of frequent follow-up visits to maximize her success with intensive lifestyle modifications for her multiple health conditions.   Objective:   Blood pressure (!) 84/57, pulse 81, temperature 98.5 F (36.9 C), height 5' (1.524 m), weight 200 lb (90.7 kg), last menstrual period 11/16/2023, SpO2 100%. Body mass index is 39.06 kg/m.  General: Cooperative, alert, well developed, in no acute distress. HEENT: Conjunctivae and lids unremarkable. Cardiovascular: Regular rhythm.  Lungs: Normal work of breathing. Neurologic: No focal deficits.   Lab Results  Component Value Date   CREATININE 0.61 11/30/2023   BUN 12 11/30/2023   NA 137 11/30/2023   K 4.2 11/30/2023   CL 99 11/30/2023   CO2 21 11/30/2023   Lab Results  Component Value Date   ALT 9 11/30/2023   AST 10 11/30/2023   ALKPHOS 86 11/30/2023   BILITOT 0.3 11/30/2023   Lab Results  Component Value Date   HGBA1C 5.3 11/30/2023   HGBA1C 5.3 06/22/2023   HGBA1C 5.1 01/07/2023   HGBA1C 5.4 08/04/2022   HGBA1C 5.2 02/20/2022   Lab Results  Component Value Date   INSULIN  10.4 11/30/2023   INSULIN  11.7 06/22/2023   INSULIN  4.8 08/04/2022   INSULIN  8.3 02/20/2022   INSULIN  11.2 10/31/2021   Lab Results  Component Value Date   TSH 1.430 11/30/2023   Lab Results  Component Value Date   CHOL 172 11/30/2023   HDL 36 (L) 11/30/2023   LDLCALC 118 (H) 11/30/2023   LDLDIRECT 126.0 09/02/2017   TRIG 97 11/30/2023   CHOLHDL  4.8 (H) 11/30/2023   Lab Results  Component Value Date   VD25OH 61.6 06/22/2023   VD25OH 22.25 (L) 01/07/2023   VD25OH 40.6 11/24/2022   Lab Results  Component Value Date   WBC 7.4 01/07/2023   HGB 12.5 01/07/2023   HCT 38.5 01/07/2023   MCV 83.4 01/07/2023   PLT 376.0 01/07/2023   Lab Results  Component Value Date   IRON 68 11/24/2022   TIBC 305 11/24/2022   FERRITIN 109 11/24/2022   Attestation Statements:   Reviewed by clinician on day of visit: allergies, medications, problem list, medical history, surgical history, family history, social history, and previous encounter notes.  I have reviewed the above documentation for accuracy and completeness, and I agree with the above. -  Lajoy Vanamburg d. Chancy Smigiel, NP-C

## 2024-01-25 ENCOUNTER — Ambulatory Visit (INDEPENDENT_AMBULATORY_CARE_PROVIDER_SITE_OTHER): Admitting: Adult Health

## 2024-01-25 VITALS — BP 110/77 | HR 69 | Temp 97.8°F | Ht 60.0 in | Wt 200.0 lb

## 2024-01-25 DIAGNOSIS — E559 Vitamin D deficiency, unspecified: Secondary | ICD-10-CM

## 2024-01-25 DIAGNOSIS — I959 Hypotension, unspecified: Secondary | ICD-10-CM | POA: Diagnosis not present

## 2024-01-25 DIAGNOSIS — E66813 Obesity, class 3: Secondary | ICD-10-CM

## 2024-01-25 DIAGNOSIS — E669 Obesity, unspecified: Secondary | ICD-10-CM

## 2024-01-25 DIAGNOSIS — F32A Depression, unspecified: Secondary | ICD-10-CM | POA: Diagnosis not present

## 2024-01-25 DIAGNOSIS — E88819 Insulin resistance, unspecified: Secondary | ICD-10-CM | POA: Diagnosis not present

## 2024-01-25 DIAGNOSIS — F419 Anxiety disorder, unspecified: Secondary | ICD-10-CM | POA: Diagnosis not present

## 2024-01-25 DIAGNOSIS — Z6839 Body mass index (BMI) 39.0-39.9, adult: Secondary | ICD-10-CM | POA: Diagnosis not present

## 2024-01-25 NOTE — Progress Notes (Signed)
 WEIGHT SUMMARY AND BIOMETRICS  Vitals Temp: 97.8 F (36.6 C) BP: 110/77 Pulse Rate: 69 SpO2: 98 %   Anthropometric Measurements Height: 5' (1.524 m) Weight: 200 lb (90.7 kg) BMI (Calculated): 39.06 Weight at Last Visit: 200 lb Weight Lost Since Last Visit: 0 lb Weight Gained Since Last Visit: 0 lb Starting Weight: 235 lb Total Weight Loss (lbs): 35 lb (15.9 kg) Peak Weight: 235 lb   Body Composition  Body Fat %: 43.5 % Fat Mass (lbs): 87.2 lbs Muscle Mass (lbs): 107.4 lbs Total Body Water (lbs): 79 lbs Visceral Fat Rating : 9   Other Clinical Data RMR: 1598 Fasting: yes Labs: no Today's Visit #: 41 Starting Date: 12/20/20    Chief Complaint:   OBESITY Anna Holland is here to discuss her progress with her obesity treatment plan.  She is on the the Category 1 Plan and states she is following her eating plan approximately 50 % of the time.  She states she is exercising Walking 30-45 minutes 3 times per week.  Interim History:  Her father suddenly passed away (MI) on 01/17/2024 Her mother was present at time of collapse. Her brother travelled with their mother to [facility] Anna Holland arrived at ED prior to EMS CPR was started en route and he never regained consciousness.  Autopsy declined and her was able to donate organs/tissue.  The funeral was well attended and reports that it was cathartic.  She has a strong, local support system of family and friends. Her long time/live in boyfriend has been very supportive.  She and her immediate family (mother, brother, and herself) will begin Grief Counseling with [facility] services this Thursday 01/28/24  She has continued on daily Celexa  10mg  (that was restarted this summer). She will see her established PCP this week and will discuss dosing then. She declines referral to Psychology at this time, provided EACP contact information  Reviewed Bioimpedance Results with pt: Muscle Mass: +2.4  lbs Adipose Mass: - 3 lbs   Subjective:   1. Insulin  resistance  Latest Reference Range & Units 08/04/22 07:49 06/22/23 10:05 11/30/23 11:08  INSULIN  2.6 - 24.9 uIU/mL 4.8 11.7 10.4   She since sudden loss of her father 3.5 weeks ago, she endorses reduced appetite r/t grief  2. Vitamin D  deficiency  Latest Reference Range & Units 11/24/22 15:56 01/07/23 14:42 06/22/23 10:05  Vitamin D , 25-Hydroxy 30.0 - 100.0 ng/mL 40.6  61.6  VITD 30.00 - 100.00 ng/mL  22.25 (L)   (L): Data is abnormally low  3. Hypotension, unspecified hypotension type BP improved at OV Today's reading 110/77 She sx's of hypotension  4. Anxiety and depression  Anna Holland was previously on daily Wellbutrin  XL 150mg  and Celexa  20mg  She was using these Rx for management of depression, anxiety, and sx's of PTSD. She denise SE with either medication. When she felt that her mood sx's with well controlled, she stopped both medications. She denied withdraw after abruptly stopping.   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 Her brother travelled with their mother to [facility] ED Anna Holland arrived at ED prior to EMS CPR was started en route and he never regained consciousness.  The funeral was well attended and reports that it was  cathartic.  She has a strong, local support system of family and friends. Her long time/live in boyfriend has been very supportive.  She and her immediate family (mother, brother, and herself) will begin Grief Counseling with Anna Holland services this Thursday 01/28/24  She has continued on daily Celexa  10mg  (that was restarted this summer). She will see her established PCP this week and will discuss dosing then. She declines referral to  Psychology at this time, provided EACP contact information  Assessment/Plan:   1. Insulin  resistance (Primary) Continue healthy eating and regular exercise  2. Vitamin D  deficiency Continue bi-weekly Ergocalciferol - denies need for refill at this time.  3. Hypotension, unspecified hypotension type Continue healthy eating and regular exercise  4. Anxiety and depression Call [phone] to meet with a [facility] representative and develop an EACP plan tailored to your organization.  5. Obesity with current BMI of 39.4  Anna Holland is currently in the action stage of change. As such, her goal is to continue with weight loss efforts. She has agreed to the Category 1 Plan.   Exercise goals: All adults should avoid inactivity. Some physical activity is better than none, and adults who participate in any amount of physical activity gain some health benefits. Adults should also include muscle-strengthening activities that involve all major muscle groups on 2 or more days a week.  Behavioral modification strategies: increasing lean protein intake, decreasing simple carbohydrates, increasing vegetables, increasing water intake, no skipping meals, meal planning and cooking strategies, keeping healthy foods in the home, ways to avoid boredom eating, and planning for success.  [name] has agreed to follow-up with our clinic in 4 weeks. She was informed of the importance of frequent follow-up visits to maximize her success with intensive lifestyle modifications for her multiple health conditions.  Objective:   Blood pressure 110/77, pulse 69, temperature 97.8 F (36.6 C), height 5' (1.524 m), weight 200 lb (90.7 kg), last menstrual period 01/05/2024, SpO2 98%. Body mass index is 39.06 kg/m.  General: Cooperative, alert, well developed, in no acute distress. HEENT: Conjunctivae and lids unremarkable. Cardiovascular: Regular rhythm.  Lungs: Normal work of breathing. Neurologic: No focal deficits.    Lab Results  Component Value Date   CREATININE 0.61 11/30/2023   BUN 12 11/30/2023   NA 137 11/30/2023   K 4.2 11/30/2023   CL 99 11/30/2023   CO2 21 11/30/2023   Lab Results  Component Value Date   ALT 9 11/30/2023   AST 10 11/30/2023   ALKPHOS 86 11/30/2023   BILITOT 0.3 11/30/2023   Lab Results  Component Value Date   HGBA1C 5.3 11/30/2023   HGBA1C 5.3 06/22/2023   HGBA1C 5.1 01/07/2023   HGBA1C 5.4 08/04/2022   HGBA1C 5.2 02/20/2022   Lab Results  Component Value Date   INSULIN  10.4 11/30/2023   INSULIN  11.7 06/22/2023   INSULIN  4.8 08/04/2022   INSULIN  8.3 02/20/2022   INSULIN  11.2 10/31/2021   Lab Results  Component Value Date   TSH 1.430 11/30/2023   Lab Results  Component Value Date   CHOL 172 11/30/2023   HDL 36 (L) 11/30/2023   LDLCALC 118 (H) 11/30/2023   LDLDIRECT 126.0 09/02/2017   TRIG 97 11/30/2023   CHOLHDL 4.8 (H) 11/30/2023   Lab Results  Component Value Date   VD25OH 61.6 06/22/2023   VD25OH 22.25 (L) 01/07/2023   VD25OH 40.6 11/24/2022   Lab Results  Component Value Date   WBC 7.4 01/07/2023   HGB 12.5 01/07/2023  HCT 38.5 01/07/2023   MCV 83.4 01/07/2023   PLT 376.0 01/07/2023   Lab Results  Component Value Date   IRON 68 11/24/2022   TIBC 305 11/24/2022   FERRITIN 109 11/24/2022   Attestation Statements:   Reviewed by clinician on day of visit: allergies, medications, problem list, medical history, surgical history, family history, social history, and previous encounter notes.  Time spent on visit including pre-visit chart review and post-visit care and charting was 27 minutes.   I have reviewed the above documentation for accuracy and completeness, and I agree with the above. -  Kendal Raffo d. Anna Woodrum, NP-C

## 2024-01-29 ENCOUNTER — Ambulatory Visit (INDEPENDENT_AMBULATORY_CARE_PROVIDER_SITE_OTHER): Admitting: Family Medicine

## 2024-01-29 ENCOUNTER — Encounter: Payer: Self-pay | Admitting: Family Medicine

## 2024-01-29 VITALS — BP 126/70 | HR 61 | Temp 98.4°F | Resp 16 | Ht 60.0 in | Wt 207.0 lb

## 2024-01-29 DIAGNOSIS — E559 Vitamin D deficiency, unspecified: Secondary | ICD-10-CM

## 2024-01-29 DIAGNOSIS — E786 Lipoprotein deficiency: Secondary | ICD-10-CM | POA: Diagnosis not present

## 2024-01-29 DIAGNOSIS — Z0000 Encounter for general adult medical examination without abnormal findings: Secondary | ICD-10-CM | POA: Diagnosis not present

## 2024-01-29 DIAGNOSIS — E785 Hyperlipidemia, unspecified: Secondary | ICD-10-CM

## 2024-01-29 NOTE — Assessment & Plan Note (Signed)
Pt's PE WNL w/ exception of BMI.  UTD on pap, Tdap, flu.  Check labs.  Anticipatory guidance provided.  

## 2024-01-29 NOTE — Progress Notes (Signed)
   Subjective:    Patient ID: Anna Holland, female    DOB: 2000/09/04, 23 y.o.   MRN: [id number]  HPI CPE- UTD on pap, Tdap  Patient Care Team    Relationship Specialty Notifications Start End  Mahlon Comer BRAVO, MD PCP - General Family Medicine  09/02/17   Tricia Tawni CROME, MD Referring Physician Dermatology  09/02/17   Raeanne Shanda SQUIBB, MD (Inactive) Consulting Physician Obstetrics and Gynecology  09/02/17     Health Maintenance  Topic Date Due   CHLAMYDIA SCREENING  Never done   Cervical Cancer Screening (Pap smear)  03/20/2024   DTaP/Tdap/Td (8 - Td or Tdap) 06/14/2030   Pneumococcal Vaccine  Completed   Influenza Vaccine  Completed   Hepatitis B Vaccines 19-59 Average Risk  Completed   HPV VACCINES  Completed   HIV Screening  Completed   Meningococcal B Vaccine  Aged Out   COVID-19 Vaccine  Discontinued   Hepatitis C Screening  Discontinued      Review of Systems Patient reports no vision/ hearing changes, adenopathy,fever, weight change,  persistant/recurrent hoarseness , swallowing issues, chest pain, palpitations, edema, persistant/recurrent cough, hemoptysis, dyspnea (rest/exertional/paroxysmal nocturnal), gastrointestinal bleeding (melena, rectal bleeding), abdominal pain, significant heartburn, bowel changes, GU symptoms (dysuria, hematuria, incontinence), Gyn symptoms (abnormal  bleeding, pain),  syncope, focal weakness, memory loss, numbness & tingling, skin/hair/nail changes, abnormal bruising or bleeding, anxiety, or depression.     Objective:   Physical Exam General Appearance:    Alert, cooperative, no distress, appears stated age, obese  Head:    Normocephalic, without obvious abnormality, atraumatic  Eyes:    PERRL, conjunctiva/corneas clear, EOM's intact, fundi    benign, both eyes  Ears:    Normal TM's and external ear canals, both ears  Nose:   Nares normal, septum midline, mucosa normal, no drainage    or sinus tenderness  Throat:   Lips,  mucosa, and tongue normal; teeth and gums normal  Neck:   Supple, symmetrical, trachea midline, no adenopathy;    Thyroid : no enlargement/tenderness/nodules  Back:     Symmetric, no curvature, ROM normal, no CVA tenderness  Lungs:     Clear to auscultation bilaterally, respirations unlabored  Chest Wall:    No tenderness or deformity   Heart:    Regular rate and rhythm, S1 and S2 normal, no murmur, rub   or gallop  Breast Exam:    Deferred to GYN  Abdomen:     Soft, non-tender, bowel sounds active all four quadrants,    no masses, no organomegaly  Genitalia:    Deferred to GYN  Rectal:    Extremities:   Extremities normal, atraumatic, no cyanosis or edema  Pulses:   2+ and symmetric all extremities  Skin:   Skin color, texture, turgor normal, no rashes or lesions  Lymph nodes:   Cervical, supraclavicular, and axillary nodes normal  Neurologic:   CNII-XII intact, normal strength, sensation and reflexes    throughout          Assessment & Plan:

## 2024-01-29 NOTE — Patient Instructions (Signed)
 Follow up in 1 year or as needed We'll notify you of your lab results and make any changes if needed It's ok to fall apart and not be strong! I'm proud of you for doing grief counseling!! Call with any questions or concerns Stay Safe!  Stay Healthy! I'm so sorry for your loss!

## 2024-01-30 LAB — CBC WITH DIFFERENTIAL/PLATELET
Absolute Lymphocytes: 2780 {cells}/uL (ref 850–3900)
Absolute Monocytes: 385 {cells}/uL (ref 200–950)
Basophils Absolute: 16 {cells}/uL (ref 0–200)
Basophils Relative: 0.2 %
Eosinophils Absolute: 57 {cells}/uL (ref 15–500)
Eosinophils Relative: 0.7 %
HCT: 37.3 % (ref 35.0–45.0)
Hemoglobin: 12.3 g/dL (ref 11.7–15.5)
MCH: 27.5 pg (ref 27.0–33.0)
MCHC: 33 g/dL (ref 32.0–36.0)
MCV: 83.3 fL (ref 80.0–100.0)
MPV: 10.5 fL (ref 7.5–12.5)
Monocytes Relative: 4.7 %
Neutro Abs: 4961 {cells}/uL (ref 1500–7800)
Neutrophils Relative %: 60.5 %
Platelets: 372 Thousand/uL (ref 140–400)
RBC: 4.48 Million/uL (ref 3.80–5.10)
RDW: 12.8 % (ref 11.0–15.0)
Total Lymphocyte: 33.9 %
WBC: 8.2 Thousand/uL (ref 3.8–10.8)

## 2024-01-30 LAB — BASIC METABOLIC PANEL WITH GFR
BUN: 10 mg/dL (ref 7–25)
CO2: 27 mmol/L (ref 20–32)
Calcium: 9.2 mg/dL (ref 8.6–10.2)
Chloride: 103 mmol/L (ref 98–110)
Creat: 0.53 mg/dL (ref 0.50–0.96)
Glucose, Bld: 83 mg/dL (ref 65–99)
Potassium: 4.2 mmol/L (ref 3.5–5.3)
Sodium: 137 mmol/L (ref 135–146)
eGFR: 132 mL/min/1.73m2 (ref >=60)

## 2024-01-30 LAB — LIPID PANEL
Cholesterol: 167 mg/dL (ref <200)
HDL: 42 mg/dL — ABNORMAL LOW (ref >=50)
LDL Cholesterol (Calc): 110 mg/dL — ABNORMAL HIGH
Non-HDL Cholesterol (Calc): 125 mg/dL (ref <130)
Total CHOL/HDL Ratio: 4 (calc) (ref <5.0)
Triglycerides: 63 mg/dL (ref <150)

## 2024-01-30 LAB — HEPATIC FUNCTION PANEL
AG Ratio: 1.5 (calc) (ref 1.0–2.5)
ALT: 10 U/L (ref 6–29)
AST: 14 U/L (ref 10–30)
Albumin: 4.1 g/dL (ref 3.6–5.1)
Alkaline phosphatase (APISO): 66 U/L (ref 31–125)
Bilirubin, Direct: 0.1 mg/dL (ref 0.0–0.2)
Globulin: 2.8 g/dL (ref 1.9–3.7)
Indirect Bilirubin: 0.3 mg/dL (ref 0.2–1.2)
Total Bilirubin: 0.4 mg/dL (ref 0.2–1.2)
Total Protein: 6.9 g/dL (ref 6.1–8.1)

## 2024-01-30 LAB — VITAMIN D 25 HYDROXY (VIT D DEFICIENCY, FRACTURES): Vit D, 25-Hydroxy: 64 ng/mL (ref 30–100)

## 2024-01-30 LAB — TSH: TSH: 0.69 m[IU]/L

## 2024-02-01 ENCOUNTER — Ambulatory Visit: Payer: Self-pay | Admitting: Family Medicine

## 2024-02-01 NOTE — Telephone Encounter (Signed)
-----   Message from Comer Greet sent at 02/01/2024  7:31 AM EDT ----- Labs look good!  No changes at this time ----- Message ----- From: Interface, [facility] Lab Results In Sent: 01/29/2024  11:42 PM EDT To: Comer FORBES Greet, MD

## 2024-02-01 NOTE — Telephone Encounter (Signed)
 Left detailed VM per DPR about lab results.

## 2024-02-22 ENCOUNTER — Ambulatory Visit (INDEPENDENT_AMBULATORY_CARE_PROVIDER_SITE_OTHER): Payer: Self-pay | Admitting: Adult Health

## 2024-02-22 ENCOUNTER — Encounter (INDEPENDENT_AMBULATORY_CARE_PROVIDER_SITE_OTHER): Payer: Self-pay | Admitting: Adult Health

## 2024-02-22 ENCOUNTER — Other Ambulatory Visit (HOSPITAL_BASED_OUTPATIENT_CLINIC_OR_DEPARTMENT_OTHER): Payer: Self-pay

## 2024-02-22 VITALS — BP 122/82 | HR 77 | Temp 98.3°F | Ht 60.0 in | Wt 207.0 lb

## 2024-02-22 DIAGNOSIS — E669 Obesity, unspecified: Secondary | ICD-10-CM | POA: Diagnosis not present

## 2024-02-22 DIAGNOSIS — E786 Lipoprotein deficiency: Secondary | ICD-10-CM | POA: Diagnosis not present

## 2024-02-22 DIAGNOSIS — Z6841 Body Mass Index (BMI) 40.0 and over, adult: Secondary | ICD-10-CM | POA: Diagnosis not present

## 2024-02-22 DIAGNOSIS — E785 Hyperlipidemia, unspecified: Secondary | ICD-10-CM | POA: Diagnosis not present

## 2024-02-22 DIAGNOSIS — F419 Anxiety disorder, unspecified: Secondary | ICD-10-CM | POA: Diagnosis not present

## 2024-02-22 DIAGNOSIS — E559 Vitamin D deficiency, unspecified: Secondary | ICD-10-CM | POA: Diagnosis not present

## 2024-02-22 DIAGNOSIS — E88819 Insulin resistance, unspecified: Secondary | ICD-10-CM | POA: Diagnosis not present

## 2024-02-22 DIAGNOSIS — F32A Depression, unspecified: Secondary | ICD-10-CM

## 2024-02-22 DIAGNOSIS — E66813 Obesity, class 3: Secondary | ICD-10-CM

## 2024-02-22 MED ORDER — VITAMIN D (ERGOCALCIFEROL) 1.25 MG (50000 UNIT) PO CAPS
50000.0000 [IU] | ORAL_CAPSULE | ORAL | 0 refills | Status: DC
  Filled 2024-02-22: qty 4, 56d supply, fill #0

## 2024-02-22 MED ORDER — CITALOPRAM HYDROBROMIDE 10 MG PO TABS
10.0000 mg | ORAL_TABLET | Freq: Every day | ORAL | 0 refills | Status: AC
  Filled 2024-02-22: qty 30, 30d supply, fill #0

## 2024-02-22 NOTE — Progress Notes (Signed)
 WEIGHT SUMMARY AND BIOMETRICS  No data recorded Anthropometric Measurements Height: 5' (1.524 m) Weight at Last Visit: 200lb Starting Weight: 235lb Peak Weight: 235lb   No data recorded Other Clinical Data RMR: 1598 Fasting: No Labs: No Today's Visit #: 42 Starting Date: 12/20/20    Chief Complaint:   OBESITY Anna Holland is here to discuss her progress with her obesity treatment plan.  She is on the the Category 1 Plan and states she is following her eating plan approximately 50 % of the time.  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 She will prefer to remain in bed and rest then rise and prepare food in the kitchen.  Exercise-she has not been participating in formal exercise.  10/24/ 2025 annual CPE with fasting labs with her established PCP/Dr. Mahlon Labs stable  Subjective:   1. Insulin  resistance  Latest Reference Range & Units 08/04/22 07:49 06/22/23 10:05 11/30/23 11:08  INSULIN  2.6 - 24.9 uIU/mL 4.8 11.7 10.4   She denies polyphagia She is not on any antidiabetic medications   2. Vitamin D  deficiency  Latest Reference Range & Units 01/29/24 14:28  Vitamin D , 25-Hydroxy 30 - 100 ng/mL 64   She is on bi-weekly Ergocalciferol - denies N/V/Muscle Weakness  3. Hyperlipidemia with low HDL Discussed Labs Lipid Panel     Component Value Date/Time   CHOL 167 01/29/2024 1428   CHOL 172 11/30/2023 1108   TRIG 63 01/29/2024 1428   HDL 42 (L) 01/29/2024 1428   HDL 36 (L) 11/30/2023 1108   CHOLHDL 4.0 01/29/2024 1428   VLDL 11.6 01/07/2023 1442   LDLCALC 110 (H) 01/29/2024 1428   LDLDIRECT 126.0 09/02/2017 1452   LABVLDL 18 11/30/2023 1108    Tot and LDL both improved  4.  Anxiety and depression She is taking daily Celexa  10mg  She has remained off Wellbutrin   She reports stable mood, denies SI/HI  She and her immediate family have begun Grief Counseling with [facility] services on 01/28/24 She will participate in therapy, however is reluctant to offer insights/feelings.  She lives with her supportive boyfriend- they have bee together > 7 years  Assessment/Plan:   1. Insulin  resistance (Primary) Increase lean protein intake Consider starting regular exercise in 2026  2. Vitamin D  deficiency Refill Vitamin D , Ergocalciferol , (DRISDOL ) 1.25 MG (50000 UNIT) CAPS capsule Take 1 capsule (50,000 Units total) by mouth every 14 (fourteen) days. Dispense: 4 capsule, Refills: 0 of 0 remaining   3. Hyperlipidemia with low HDL Limit sat fat intake Consider starting regular exercise in 2026  4. Anxiety and depression Continue to surround self with positive/supportive family and friends Continue Grief Counseling with family Consider adding in regular exercise after the new year  Anna Holland is not currently in the action stage of change. As such, her goal is to maintain weight for now. She has agreed to the Category 1 Plan.   Exercise goals: No exercise has been prescribed at this time.  Behavioral modification strategies: increasing lean protein intake, decreasing simple carbohydrates, increasing vegetables, increasing water intake, no skipping meals, meal planning and cooking strategies, keeping healthy foods in the home, ways to avoid boredom eating, and planning for success.  Anna Holland has agreed  to follow-up with our clinic in 4 weeks. She was informed of the importance of frequent follow-up visits to maximize her success with intensive lifestyle modifications for her multiple health conditions.   Objective:   Height 5' (1.524 m), last menstrual period 01/05/2024. Body mass index is 40.43 kg/m.  General: Cooperative, alert, well developed, in no acute  distress. HEENT: Conjunctivae and lids unremarkable. Cardiovascular: Regular rhythm.  Lungs: Normal work of breathing. Neurologic: No focal deficits.   Lab Results  Component Value Date   CREATININE 0.53 01/29/2024   BUN 10 01/29/2024   NA 137 01/29/2024   K 4.2 01/29/2024   CL 103 01/29/2024   CO2 27 01/29/2024   Lab Results  Component Value Date   ALT 10 01/29/2024   AST 14 01/29/2024   ALKPHOS 86 11/30/2023   BILITOT 0.4 01/29/2024   Lab Results  Component Value Date   HGBA1C 5.3 11/30/2023   HGBA1C 5.3 06/22/2023   HGBA1C 5.1 01/07/2023   HGBA1C 5.4 08/04/2022   HGBA1C 5.2 02/20/2022   Lab Results  Component Value Date   INSULIN  10.4 11/30/2023   INSULIN  11.7 06/22/2023   INSULIN  4.8 08/04/2022   INSULIN  8.3 02/20/2022   INSULIN  11.2 10/31/2021   Lab Results  Component Value Date   TSH 0.69 01/29/2024   Lab Results  Component Value Date   CHOL 167 01/29/2024   HDL 42 (L) 01/29/2024   LDLCALC 110 (H) 01/29/2024   LDLDIRECT 126.0 09/02/2017   TRIG 63 01/29/2024   CHOLHDL 4.0 01/29/2024   Lab Results  Component Value Date   VD25OH 64 01/29/2024   VD25OH 61.6 06/22/2023   VD25OH 22.25 (L) 01/07/2023   Lab Results  Component Value Date   WBC 8.2 01/29/2024   HGB 12.3 01/29/2024   HCT 37.3 01/29/2024   MCV 83.3 01/29/2024   PLT 372 01/29/2024   Lab Results  Component Value Date   IRON 68 11/24/2022   TIBC 305 11/24/2022   FERRITIN 109 11/24/2022   Attestation Statements:   Reviewed by clinician on day of visit: allergies, medications, problem list, medical history, surgical history, family history, social history, and previous encounter notes.  I have reviewed the above documentation for accuracy and completeness, and I agree with the above. -  Eleah Lahaie d. Mylen Mangan, NP-C

## 2024-03-21 ENCOUNTER — Ambulatory Visit (INDEPENDENT_AMBULATORY_CARE_PROVIDER_SITE_OTHER): Admitting: Adult Health

## 2024-03-30 ENCOUNTER — Encounter (INDEPENDENT_AMBULATORY_CARE_PROVIDER_SITE_OTHER): Payer: Self-pay | Admitting: Adult Health

## 2024-03-30 ENCOUNTER — Ambulatory Visit (INDEPENDENT_AMBULATORY_CARE_PROVIDER_SITE_OTHER): Admitting: Adult Health

## 2024-03-30 VITALS — BP 122/76 | HR 62 | Temp 98.3°F | Ht 60.0 in | Wt 207.0 lb

## 2024-03-30 DIAGNOSIS — E88819 Insulin resistance, unspecified: Secondary | ICD-10-CM

## 2024-03-30 DIAGNOSIS — Z6841 Body Mass Index (BMI) 40.0 and over, adult: Secondary | ICD-10-CM

## 2024-03-30 DIAGNOSIS — F32A Depression, unspecified: Secondary | ICD-10-CM | POA: Diagnosis not present

## 2024-03-30 DIAGNOSIS — F419 Anxiety disorder, unspecified: Secondary | ICD-10-CM | POA: Diagnosis not present

## 2024-03-30 DIAGNOSIS — E669 Obesity, unspecified: Secondary | ICD-10-CM

## 2024-03-30 DIAGNOSIS — E559 Vitamin D deficiency, unspecified: Secondary | ICD-10-CM

## 2024-03-30 DIAGNOSIS — E66813 Obesity, class 3: Secondary | ICD-10-CM

## 2024-03-30 NOTE — Progress Notes (Signed)
 "    WEIGHT SUMMARY AND BIOMETRICS  Vitals Temp: 98.3 F (36.8 C) BP: 122/76 Pulse Rate: 62 SpO2: 99 %   Anthropometric Measurements Height: 5' (1.524 m) Weight: 207 lb (93.9 kg) BMI (Calculated): 40.43 Weight at Last Visit: 207lb Weight Lost Since Last Visit: 0lb Weight Gained Since Last Visit: 0lb Starting Weight: 235lb Total Weight Loss (lbs): 28 lb (12.7 kg) Peak Weight: 235lb   Body Composition  Body Fat %: 46.3 % Fat Mass (lbs): 96.2 lbs Muscle Mass (lbs): 105.8 lbs Total Body Water (lbs): 83.4 lbs Visceral Fat Rating : 11   Other Clinical Data RMR: 1598 Fasting: yes Labs: No Today's Visit #: 66 Starting Date: 12/20/20    Chief Complaint:   OBESITY Anna Holland is here to discuss her progress with her obesity treatment plan.  She is on the the Category 1 Plan and states she is following her eating plan approximately 85 % of the time.  She states she is exercising: NE[employer] Activities    Interim History:  She will work 7p-7a tonight ([employer]) then will travel to [place], [place] She and her family will celebrate the holidays with extended family in [place], [place] She is off from work until 07 Apr 2024  She and her family have been attending grief therapy bi-weekly. Anna Holland endorses stable mood and feels that the therapy has been helpful for her family during this acute period of grieving.  She denies family hx of MENS 2 or MTC She denies personal hx of pancreatitis She has IUD for birth control.  Discussed adding on either GLP-1 or GIP/GLP-1 therapy to assist with metabolic conditions and to aid in weight loss.  Currently her insurance will not cover, however out of pocket expenses are anticipated to decline in 2026  Subjective:   1. Insulin  resistance Lab Results  Component Value Date   HGBA1C 5.3 11/30/2023   HGBA1C 5.3 06/22/2023   HGBA1C 5.1 01/07/2023     Latest Reference Range & Units 08/04/22 07:49 06/22/23 10:05 11/30/23  11:08  INSULIN  2.6 - 24.9 uIU/mL 4.8 11.7 10.4   She denies family hx of MENS 2 or MTC She denies personal hx of pancreatitis She has IUD for birth control.  Discussed adding on either GLP-1 or GIP/GLP-1 therapy to assist with metabolic conditions and to aid in weight loss.  Currently her insurance will not cover, however out of pocket expenses are anticipated to decline in 2026  2. Vitamin D  deficiency  Latest Reference Range & Units 11/24/22 15:56 06/22/23 10:05 01/29/24 14:28  Vitamin D , 25-Hydroxy 30 - 100 ng/mL 40.6 61.6 64   She is on bi-weekly Ergocalciferol    3. Anxiety and depression She tries to remember to take daily Celexa  10mg  each morning. She endorses difficulty swallowing tablets, rec taking with scoop of applesauce or greek yogurt She endorses stable mood, denies SI/HI  Assessment/Plan:   1. Insulin  resistance (Primary) Consider starting GLP-1 or GIP/GLP-1 therapy in 2026  2. Vitamin D  deficiency Monitor Labs  3. Anxiety and depression Increase regular exercise Set reminder to take daily Celexa  10mg   4. Obesity with current BMI of 40.5 Consider starting GLP-1 or GIP/GLP-1 therapy in 2026  Anna Holland is currently in the action stage of change. As such, her goal is to continue with weight loss efforts. She has agreed to the Category 1 Plan.   Exercise goals: For substantial health benefits, adults should do at least 150 minutes (2 hours and 30 minutes) a week of moderate-intensity,  or 75 minutes (1 hour and 15 minutes) a week of vigorous-intensity aerobic physical activity, or an equivalent combination of moderate- and vigorous-intensity aerobic activity. Aerobic activity should be performed in episodes of at least 10 minutes, and preferably, it should be spread throughout the week.  Behavioral modification strategies: increasing lean protein intake, decreasing simple carbohydrates, increasing vegetables, increasing water intake, no skipping meals, meal planning  and cooking strategies, keeping healthy foods in the home, ways to avoid boredom eating, emotional eating strategies, travel eating strategies, holiday eating strategies , celebration eating strategies, and planning for success.  Anna Holland has agreed to follow-up with our clinic in 4 weeks. She was informed of the importance of frequent follow-up visits to maximize her success with intensive lifestyle modifications for her multiple health conditions.   Objective:   Blood pressure 122/76, pulse 62, temperature 98.3 F (36.8 C), height 5' (1.524 m), weight 207 lb (93.9 kg), SpO2 99%. Body mass index is 40.43 kg/m.  General: Cooperative, alert, well developed, in no acute distress. HEENT: Conjunctivae and lids unremarkable. Cardiovascular: Regular rhythm.  Lungs: Normal work of breathing. Neurologic: No focal deficits.   Lab Results  Component Value Date   CREATININE 0.53 01/29/2024   BUN 10 01/29/2024   NA 137 01/29/2024   K 4.2 01/29/2024   CL 103 01/29/2024   CO2 27 01/29/2024   Lab Results  Component Value Date   ALT 10 01/29/2024   AST 14 01/29/2024   ALKPHOS 86 11/30/2023   BILITOT 0.4 01/29/2024   Lab Results  Component Value Date   HGBA1C 5.3 11/30/2023   HGBA1C 5.3 06/22/2023   HGBA1C 5.1 01/07/2023   HGBA1C 5.4 08/04/2022   HGBA1C 5.2 02/20/2022   Lab Results  Component Value Date   INSULIN  10.4 11/30/2023   INSULIN  11.7 06/22/2023   INSULIN  4.8 08/04/2022   INSULIN  8.3 02/20/2022   INSULIN  11.2 10/31/2021   Lab Results  Component Value Date   TSH 0.69 01/29/2024   Lab Results  Component Value Date   CHOL 167 01/29/2024   HDL 42 (L) 01/29/2024   LDLCALC 110 (H) 01/29/2024   LDLDIRECT 126.0 09/02/2017   TRIG 63 01/29/2024   CHOLHDL 4.0 01/29/2024   Lab Results  Component Value Date   VD25OH 64 01/29/2024   VD25OH 61.6 06/22/2023   VD25OH 22.25 (L) 01/07/2023   Lab Results  Component Value Date   WBC 8.2 01/29/2024   HGB 12.3 01/29/2024   HCT  37.3 01/29/2024   MCV 83.3 01/29/2024   PLT 372 01/29/2024   Lab Results  Component Value Date   IRON 68 11/24/2022   TIBC 305 11/24/2022   FERRITIN 109 11/24/2022   Attestation Statements:   Reviewed by clinician on day of visit: allergies, medications, problem list, medical history, surgical history, family history, social history, and previous encounter notes.  Time spent on visit including pre-visit chart review and post-visit care and charting was 26 minutes.   I have reviewed the above documentation for accuracy and completeness, and I agree with the above. -  Oran Dillenburg d. Lavaughn Haberle, NP-C "

## 2024-05-02 ENCOUNTER — Ambulatory Visit
Admission: RE | Admit: 2024-05-02 | Discharge: 2024-05-02 | Disposition: A | Discharge status: Home / Self Care | Attending: Internal Medicine | Admitting: Internal Medicine

## 2024-05-02 ENCOUNTER — Ambulatory Visit

## 2024-05-02 ENCOUNTER — Ambulatory Visit (INDEPENDENT_AMBULATORY_CARE_PROVIDER_SITE_OTHER): Admitting: Adult Health

## 2024-05-02 VITALS — BP 110/77 | HR 80 | Temp 99.5°F | Resp 18 | Ht 60.0 in | Wt 210.0 lb

## 2024-05-02 DIAGNOSIS — R0989 Other specified symptoms and signs involving the circulatory and respiratory systems: Secondary | ICD-10-CM | POA: Diagnosis not present

## 2024-05-02 DIAGNOSIS — R0981 Nasal congestion: Secondary | ICD-10-CM | POA: Diagnosis not present

## 2024-05-02 DIAGNOSIS — R071 Chest pain on breathing: Secondary | ICD-10-CM | POA: Diagnosis not present

## 2024-05-02 LAB — POCT INFLUENZA A/B
Influenza A, POC: NEGATIVE
Influenza B, POC: NEGATIVE

## 2024-05-02 LAB — POC SOFIA SARS ANTIGEN FIA: SARS Coronavirus 2 Ag: NEGATIVE

## 2024-05-02 NOTE — ED Provider Notes (Signed)
 " [facility]    CSN: [id number] Arrival date & time: 05/02/24  1423      History   Chief Complaint Chief Complaint  Patient presents with   Generalized Body Aches    Chest wall discomfort on inspiration - Entered by patient    HPI Anna Holland is a 24 y.o. female.   24 year old female presents urgent care with complaints of pain in the center of the chest with taking deep breaths, sneezing or coughing.  This started on Friday.  Prior to her symptoms on Friday she did have an upper respiratory infection with some coughing and congestion.  She has not had any fevers or chills.  She did have some muscular pain in her back as well as her shoulder but those have resolved.  She denies any shortness of breath or wheezing.  She is not having severe coughing.  She has taken Mucinex and cold and flu medications for her symptoms.     Past Medical History:  Diagnosis Date   Allergy    Anxiety    Depression    Heart burn    Knee pain    Lactose intolerance    Plantar fasciitis    Sleep apnea    SOB (shortness of breath)    Vision abnormalities     Patient Active Problem List   Diagnosis Date Noted   Other Specified Feeding or Eating Disorder, Emotional Eating Behaviors 09/26/2022   Hyperlipidemia with low HDL 08/04/2022   Other fatigue 02/20/2022   Insulin  resistance 02/20/2022   Vitamin D  deficiency 02/20/2022   Intrinsic eczema 10/24/2021   Encounter for long-term current use of medication 10/24/2021   Low HDL (under 40) 12/26/2020   Acne vulgaris 12/20/2020   Atopic dermatitis 12/20/2020   GERD (gastroesophageal reflux disease) 06/13/2020   Obesity - Start BMI- 45.9 06/13/2020   Pain in right foot 06/16/2019   Bilateral impacted cerumen 12/06/2018   Physical exam 09/02/2017    Past Surgical History:  Procedure Laterality Date   FRACTURE SURGERY     left arm in 2009   LAPAROSCOPIC APPENDECTOMY N/A 12/30/2012   Procedure: APPENDECTOMY LAPAROSCOPIC;   Surgeon: CHRISTELLA. Julietta Millman, MD;  Location: [facility] OR;  Service: Pediatrics;  Laterality: N/A;    OB History     Gravida  0   Para  0   Term  0   Preterm  0   AB  0   Living  0      SAB  0   IAB  0   Ectopic  0   Multiple  0   Live Births  0            Home Medications    Prior to Admission medications  Medication Sig Start Date End Date Taking? Authorizing Provider  cetirizine  (ZYRTEC ) 10 MG tablet Take 1 tablet (10 mg total) by mouth daily. 05/07/21  Yes Tabori, Katherine E, MD  citalopram  (CELEXA ) 10 MG tablet Take 1 tablet (10 mg total) by mouth daily. 02/22/24  Yes Danford, Rockie D, NP  fluticasone  (FLONASE ) 50 MCG/ACT nasal spray SHAKE LIQUID AND USE 2 SPRAYS IN EACH NOSTRIL DAILY 07/07/22  Yes Tabori, Katherine E, MD  levonorgestrel [facility]) 19.5 MG IUD 1 each by Intrauterine route once.   Yes [provider]  meloxicam  (MOBIC ) 15 MG tablet Take 1 tablet (15 mg total) by mouth daily. 10/24/21  Yes Tabori, Katherine E, MD  Olopatadine  HCl 0.2 % SOLN  04/30/20  Yes  [provider]  pantoprazole  (PROTONIX ) 40 MG tablet Take 1 tablet (40 mg total) by mouth daily. 11/30/23  Yes Tabori, Katherine E, MD  polyethylene glycol (MIRALAX  / GLYCOLAX ) 17 g packet Take 17 g by mouth 2 (two) times daily. Until stooling regularly 02/11/21  Yes Opalski, Barnie, DO  promethazine  (PHENERGAN ) 25 MG tablet Take 1 tablet (25 mg total) by mouth every 8 (eight) hours as needed for nausea or vomiting. 03/17/22  Yes Tabori, Katherine E, MD  spironolactone (ALDACTONE) 50 MG tablet Take 100 mg in the morning and 50 mg at night Orally Once a day for 90 days   Yes [provider]  tretinoin (RETIN-A) 0.025 % cream Mix pea size amount with moisturizer and apply all over face Externally Start using every other night and increase to nightly as tolerable for 30 days 02/16/23  Yes [provider]  triamcinolone  ointment (KENALOG ) 0.1 %    Yes [provider]   Vitamin D , Ergocalciferol , (DRISDOL ) 1.25 MG (50000 UNIT) CAPS capsule Take 1 capsule (50,000 Units total) by mouth every 14 (fourteen) days. 02/22/24  Yes Danford, Rockie D, NP  SUMAtriptan  (IMITREX ) 50 MG tablet Take 1 tablet (50 mg total) by mouth once for 1 dose. May repeat in 2 hours if headache persists or recurs. 03/17/22 03/30/24  Mahlon Comer BRAVO, MD    Family History Family History  Problem Relation Age of Onset   Hypertension Mother    Hyperlipidemia Mother    Depression Mother    Anxiety disorder Mother    Sleep apnea Mother    Obesity Mother    Arthritis Father    Obesity Father    Sleep apnea Father    Depression Brother    Arthritis Maternal Grandmother    Diabetes Maternal Grandmother    Hypertension Maternal Grandmother    Miscarriages / Stillbirths Maternal Grandmother    Stroke Maternal Grandmother    Heart disease Maternal Grandfather    Hypertension Maternal Grandfather    Stroke Maternal Grandfather     Social History Social History[1]   Allergies   Patient has no known allergies.   Review of Systems Review of Systems  Constitutional:  Negative for chills and fever.  HENT:  Negative for ear pain and sore throat.   Eyes:  Negative for pain and visual disturbance.  Respiratory:  Positive for chest tightness. Negative for cough and shortness of breath.   Cardiovascular:  Negative for chest pain and palpitations.  Gastrointestinal:  Negative for abdominal pain and vomiting.  Genitourinary:  Negative for dysuria and hematuria.  Musculoskeletal:  Negative for arthralgias and back pain.  Skin:  Negative for color change and rash.  Neurological:  Negative for seizures and syncope.  All other systems reviewed and are negative.    Physical Exam Triage Vital Signs ED Triage Vitals  Encounter Vitals Group     BP 05/02/24 1516 110/77     Girls Systolic BP Percentile --      Girls Diastolic BP Percentile --      Boys Systolic BP Percentile --       Boys Diastolic BP Percentile --      Pulse Rate 05/02/24 1516 80     Resp 05/02/24 1516 18     Temp 05/02/24 1516 99.5 F (37.5 C)     Temp Source 05/02/24 1516 Oral     SpO2 05/02/24 1516 99 %     Weight 05/02/24 1514 210 lb (95.3 kg)  Height 05/02/24 1514 5' (1.524 m)     Head Circumference --      Peak Flow --      Pain Score 05/02/24 1514 4     Pain Loc --      Pain Education --      Exclude from Growth Chart --    No data found.  Updated Vital Signs BP 110/77 (BP Location: Right Arm)   Pulse 80   Temp 99.5 F (37.5 C) (Oral)   Resp 18   Ht 5' (1.524 m)   Wt 210 lb (95.3 kg)   SpO2 99%   BMI 41.01 kg/m   Visual Acuity Right Eye Distance:   Left Eye Distance:   Bilateral Distance:    Right Eye Near:   Left Eye Near:    Bilateral Near:     Physical Exam Vitals and nursing note reviewed.  Constitutional:      General: She is not in acute distress.    Appearance: She is well-developed.  HENT:     Head: Normocephalic and atraumatic.  Eyes:     Conjunctiva/sclera: Conjunctivae normal.  Cardiovascular:     Rate and Rhythm: Normal rate and regular rhythm.     Heart sounds: No murmur heard. Pulmonary:     Effort: Pulmonary effort is normal. No tachypnea or respiratory distress.     Breath sounds: Normal breath sounds. No decreased breath sounds, wheezing or rhonchi.  Abdominal:     Palpations: Abdomen is soft.     Tenderness: There is no abdominal tenderness.  Musculoskeletal:        General: No swelling.     Cervical back: Neck supple.  Skin:    General: Skin is warm and dry.     Capillary Refill: Capillary refill takes less than 2 seconds.  Neurological:     Mental Status: She is alert.  Psychiatric:        Mood and Affect: Mood normal.      UC Treatments / Results  Labs (all labs ordered are listed, but only abnormal results are displayed) Labs Reviewed  POCT INFLUENZA A/B - Normal  POC SOFIA SARS ANTIGEN FIA - Normal     EKG   Radiology DG Chest 2 View Result Date: 05/02/2024 EXAM: 2 VIEW(S) XRAY OF THE CHEST 05/02/2024 03:56:00 PM COMPARISON: None available. CLINICAL HISTORY: Pain in chest with deep breathing, recent upper respiratory infection. FINDINGS: LUNGS AND PLEURA: No focal pulmonary opacity. No pleural effusion. No pneumothorax. HEART AND MEDIASTINUM: No acute abnormality of the cardiac and mediastinal silhouettes. BONES AND SOFT TISSUES: No acute osseous abnormality. IMPRESSION: 1. No acute cardiopulmonary abnormality. Electronically signed by: Elsie Gravely MD 05/02/2024 04:17 PM EST RP Workstation: [id number]    Procedures Procedures (including critical care time)  Medications Ordered in UC Medications - No data to display  Initial Impression / Assessment and Plan / UC Course  I have reviewed the triage vital signs and the nursing notes.  Pertinent labs & imaging results that were available during my care of the patient were reviewed by me and considered in my medical decision making (see chart for details).     Nasal congestion - Plan: POCT Influenza A/B, POC SARS Coronavirus Ag, POCT Influenza A/B, POC SARS Coronavirus Ag  Chest congestion - Plan: POCT Influenza A/B, POC SARS Coronavirus Ag, POCT Influenza A/B, POC SARS Coronavirus Ag  Chest pain varying with breathing - Plan: DG Chest 2 View, DG Chest 2 View   Flu A, flu  B and COVID testing are negative.  Chest x-ray done today.  Final evaluation by the radiologist does not show any acute findings.  Symptoms and physical exam findings are most consistent with inflammation in the chest wall versus possible muscular strain.  Physical exam findings and vital signs are otherwise reassuring.  Can try over-the-counter ibuprofen to help with pain.  Could consider using heating pad as well.  Likely symptoms should resolve within the next 3 to 4 days.  If you feel that symptoms are not resolving or you feel your symptoms are worsening  significantly then recommend returning to urgent care.  Final Clinical Impressions(s) / UC Diagnoses   Final diagnoses:  Nasal congestion  Chest congestion  Chest pain varying with breathing     Discharge Instructions      Flu A, flu B and COVID testing are negative.  Chest x-ray done today.  Final evaluation by the radiologist does not show any acute findings.  Symptoms and physical exam findings are most consistent with inflammation in the chest wall versus possible muscular strain.  Physical exam findings and vital signs are otherwise reassuring.  Can try over-the-counter ibuprofen to help with pain.  Could consider using heating pad as well.  Likely symptoms should resolve within the next 3 to 4 days.  If you feel that symptoms are not resolving or you feel your symptoms are worsening significantly then recommend returning to urgent care.     ED Prescriptions   None    PDMP not reviewed this encounter.    [1]  Social History Tobacco Use   Smoking status: Never   Smokeless tobacco: Never  Vaping Use   Vaping status: Never Used  Substance Use Topics   Alcohol use: Never   Drug use: No     Teresa Almarie LABOR, PA-C 05/02/24 1624  "

## 2024-05-02 NOTE — ED Triage Notes (Signed)
 Patient c/o chest wall discomfort, worse when she takes a deep breath.  Denies any SOB.  Sx's have been going on x 3 days.  Patient does have a URI, nasal and chest congestion.  Patient has taken Mucinex cold/flu medication.

## 2024-05-02 NOTE — Discharge Instructions (Addendum)
 Flu A, flu B and COVID testing are negative.  Chest x-ray done today.  Final evaluation by the radiologist does not show any acute findings.  Symptoms and physical exam findings are most consistent with inflammation in the chest wall versus possible muscular strain.  Physical exam findings and vital signs are otherwise reassuring.  Can try over-the-counter ibuprofen to help with pain.  Could consider using heating pad as well.  Likely symptoms should resolve within the next 3 to 4 days.  If you feel that symptoms are not resolving or you feel your symptoms are worsening significantly then recommend returning to urgent care.

## 2024-05-03 ENCOUNTER — Telehealth (INDEPENDENT_AMBULATORY_CARE_PROVIDER_SITE_OTHER): Payer: Self-pay

## 2024-05-03 NOTE — Telephone Encounter (Signed)
 Called pt and she r/s visit for 05/06/24 this friday.

## 2024-05-06 ENCOUNTER — Other Ambulatory Visit (HOSPITAL_COMMUNITY): Payer: Self-pay

## 2024-05-06 ENCOUNTER — Ambulatory Visit (INDEPENDENT_AMBULATORY_CARE_PROVIDER_SITE_OTHER): Admitting: Adult Health

## 2024-05-06 ENCOUNTER — Encounter (INDEPENDENT_AMBULATORY_CARE_PROVIDER_SITE_OTHER): Payer: Self-pay | Admitting: Adult Health

## 2024-05-06 VITALS — BP 115/77 | HR 86 | Temp 98.2°F | Ht 60.0 in | Wt 207.0 lb

## 2024-05-06 DIAGNOSIS — Z6841 Body Mass Index (BMI) 40.0 and over, adult: Secondary | ICD-10-CM | POA: Diagnosis not present

## 2024-05-06 DIAGNOSIS — I959 Hypotension, unspecified: Secondary | ICD-10-CM | POA: Diagnosis not present

## 2024-05-06 DIAGNOSIS — E669 Obesity, unspecified: Secondary | ICD-10-CM

## 2024-05-06 DIAGNOSIS — E88819 Insulin resistance, unspecified: Secondary | ICD-10-CM | POA: Diagnosis not present

## 2024-05-06 DIAGNOSIS — E66813 Obesity, class 3: Secondary | ICD-10-CM

## 2024-05-06 DIAGNOSIS — E559 Vitamin D deficiency, unspecified: Secondary | ICD-10-CM

## 2024-05-06 MED ORDER — VITAMIN D (ERGOCALCIFEROL) 1.25 MG (50000 UNIT) PO CAPS
50000.0000 [IU] | ORAL_CAPSULE | ORAL | 0 refills | Status: AC
  Filled 2024-05-06: qty 4, 56d supply, fill #0

## 2024-05-06 NOTE — Progress Notes (Unsigned)
 "    WEIGHT SUMMARY AND BIOMETRICS  Vitals Temp: 98.2 F (36.8 C) BP: 115/77 Pulse Rate: 86 SpO2: 97 %   Anthropometric Measurements Height: 5' (1.524 m) Weight: 207 lb (93.9 kg) BMI (Calculated): 40.43 Weight at Last Visit: 207lb Weight Lost Since Last Visit: 0lb Weight Gained Since Last Visit: 0lb Starting Weight: 235lb Total Weight Loss (lbs): 28 lb (12.7 kg) Peak Weight: 235lb   Body Composition  Body Fat %: 45.2 % Fat Mass (lbs): 93.6 lbs Muscle Mass (lbs): 107.6 lbs Total Body Water (lbs): 79.8 lbs Visceral Fat Rating : 10   Other Clinical Data RMR: [id number] Fasting: No Labs: no Today's Visit #: 44 Starting Date: 12/20/20    Chief Complaint:   OBESITY Anna Holland is here to discuss her progress with her obesity treatment plan.  She is on the the Category 1 Plan and states she is following her eating plan approximately 70 % of the time.  She states she is exercising Gym 60 minutes 3 times per week.  Interim History:  She and her family are still adjusting to the sudden death of her father.  Snacks for work: yogurt, buff bars, fruit snacks Hunger/appetite-*** Cravings- *** Stress- *** Sleep- *** Exercise-*** Hydration-***  Subjective:   1. Hypotension, unspecified hypotension type BP stable at OV  2. Insulin  resistance  Latest Reference Range & Units 08/04/22 07:49 06/22/23 10:05 11/30/23 11:08  INSULIN  2.6 - 24.9 uIU/mL 4.8 11.7 10.4   She is not on any antidiabetic medications  3. Vitamin D  deficiency  Latest Reference Range & Units 11/24/22 15:56 01/07/23 14:42 06/22/23 10:05 01/29/24 14:28  Vitamin D , 25-Hydroxy 30 - 100 ng/mL 40.6  61.6 64  VITD 30.00 - 100.00 ng/mL  22.25 (L)    (L): Data is abnormally low  Assessment/Plan:   1. Hypotension, unspecified hypotension type ***  2. Insulin  resistance (Primary) ***  3. Vitamin D  deficiency Refill - Vitamin D , Ergocalciferol , (DRISDOL ) 1.25 MG (50000 UNIT) CAPS capsule; Take 1 capsule  (50,000 Units total) by mouth every 14 (fourteen) days.  Dispense: 4 capsule; Refill: 0  4. Obesity with current BMI of 40.4  Anna Holland is currently in the action stage of change. As such, her goal is to continue with weight loss efforts. She has agreed to the Category 1 Plan.   Exercise goals: For substantial health benefits, adults should do at least 150 minutes (2 hours and 30 minutes) a week of moderate-intensity, or 75 minutes (1 hour and 15 minutes) a week of vigorous-intensity aerobic physical activity, or an equivalent combination of moderate- and vigorous-intensity aerobic activity. Aerobic activity should be performed in episodes of at least 10 minutes, and preferably, it should be spread throughout the week.  Behavioral modification strategies: increasing lean protein intake, decreasing simple carbohydrates, increasing vegetables, increasing water intake, no skipping meals, meal planning and cooking strategies, keeping healthy foods in the home, ways to avoid boredom eating, and planning for success.  Anna Holland has agreed to follow-up with our clinic in 4 weeks. She was informed of the importance of frequent follow-up visits to maximize her success with intensive lifestyle modifications for her multiple health conditions.   Check Fasting Labs Spring 2026  Objective:   Blood pressure 115/77, pulse 86, temperature 98.2 F (36.8 C), height 5' (1.524 m), weight 207 lb (93.9 kg), last menstrual period 04/07/2024, SpO2 97%. Body mass index is 40.43 kg/m.  General: Cooperative, alert, well developed, in no acute distress. HEENT: Conjunctivae and lids unremarkable. Cardiovascular: Regular rhythm.  Lungs: Normal work of breathing. Neurologic: No focal deficits.   Lab Results  Component Value Date   CREATININE 0.53 01/29/2024   BUN 10 01/29/2024   NA 137 01/29/2024   K 4.2 01/29/2024   CL 103 01/29/2024   CO2 27 01/29/2024   Lab Results  Component Value Date   ALT 10 01/29/2024    AST 14 01/29/2024   ALKPHOS 86 11/30/2023   BILITOT 0.4 01/29/2024   Lab Results  Component Value Date   HGBA1C 5.3 11/30/2023   HGBA1C 5.3 06/22/2023   HGBA1C 5.1 01/07/2023   HGBA1C 5.4 08/04/2022   HGBA1C 5.2 02/20/2022   Lab Results  Component Value Date   INSULIN  10.4 11/30/2023   INSULIN  11.7 06/22/2023   INSULIN  4.8 08/04/2022   INSULIN  8.3 02/20/2022   INSULIN  11.2 10/31/2021   Lab Results  Component Value Date   TSH 0.69 01/29/2024   Lab Results  Component Value Date   CHOL 167 01/29/2024   HDL 42 (L) 01/29/2024   LDLCALC 110 (H) 01/29/2024   LDLDIRECT 126.0 09/02/2017   TRIG 63 01/29/2024   CHOLHDL 4.0 01/29/2024   Lab Results  Component Value Date   VD25OH 64 01/29/2024   VD25OH 61.6 06/22/2023   VD25OH 22.25 (L) 01/07/2023   Lab Results  Component Value Date   WBC 8.2 01/29/2024   HGB 12.3 01/29/2024   HCT 37.3 01/29/2024   MCV 83.3 01/29/2024   PLT 372 01/29/2024   Lab Results  Component Value Date   IRON 68 11/24/2022   TIBC 305 11/24/2022   FERRITIN 109 11/24/2022   Attestation Statements:   Reviewed by clinician on day of visit: allergies, medications, problem list, medical history, surgical history, family history, social history, and previous encounter notes.  I have reviewed the above documentation for accuracy and completeness, and I agree with the above. -  Diontay Rosencrans d. Kate Larock, NP-C "

## 2024-06-07 ENCOUNTER — Ambulatory Visit (INDEPENDENT_AMBULATORY_CARE_PROVIDER_SITE_OTHER): Admitting: Physician Assistant

## 2025-01-30 ENCOUNTER — Encounter: Admitting: Family Medicine
